# Patient Record
Sex: Female | Born: 1965 | Race: White | Hispanic: No | Marital: Married | Smoking: Never smoker
Health system: Southern US, Community
[De-identification: ages and names within clinical notes are randomized; demographics above are authoritative.]

## PROBLEM LIST (undated history)

## (undated) DIAGNOSIS — D219 Benign neoplasm of connective and other soft tissue, unspecified: Secondary | ICD-10-CM

## (undated) DIAGNOSIS — I10 Essential (primary) hypertension: Secondary | ICD-10-CM

## (undated) DIAGNOSIS — Z973 Presence of spectacles and contact lenses: Secondary | ICD-10-CM

## (undated) DIAGNOSIS — T7840XA Allergy, unspecified, initial encounter: Secondary | ICD-10-CM

## (undated) DIAGNOSIS — E119 Type 2 diabetes mellitus without complications: Secondary | ICD-10-CM

## (undated) DIAGNOSIS — K769 Liver disease, unspecified: Secondary | ICD-10-CM

## (undated) DIAGNOSIS — M1990 Unspecified osteoarthritis, unspecified site: Secondary | ICD-10-CM

## (undated) HISTORY — DX: Type 2 diabetes mellitus without complications: E11.9

## (undated) HISTORY — PX: SIGMOIDOSCOPY: SUR1295

## (undated) HISTORY — DX: Allergy, unspecified, initial encounter: T78.40XA

## (undated) HISTORY — PX: ENDOMETRIAL ABLATION W/ NOVASURE: SUR434

## (undated) HISTORY — DX: Essential (primary) hypertension: I10

## (undated) HISTORY — DX: Unspecified osteoarthritis, unspecified site: M19.90

---

## 1999-01-22 ENCOUNTER — Encounter: Admission: RE | Admit: 1999-01-22 | Discharge: 1999-01-22 | Payer: Self-pay | Admitting: *Deleted

## 1999-01-22 ENCOUNTER — Encounter: Payer: Self-pay | Admitting: *Deleted

## 1999-01-31 ENCOUNTER — Encounter: Admission: RE | Admit: 1999-01-31 | Discharge: 1999-02-06 | Payer: Self-pay | Admitting: *Deleted

## 1999-08-17 ENCOUNTER — Other Ambulatory Visit: Admission: RE | Admit: 1999-08-17 | Discharge: 1999-08-17 | Payer: Self-pay | Admitting: Family Medicine

## 2000-11-14 ENCOUNTER — Other Ambulatory Visit: Admission: RE | Admit: 2000-11-14 | Discharge: 2000-11-14 | Payer: Self-pay | Admitting: Family Medicine

## 2002-01-19 ENCOUNTER — Encounter: Payer: Self-pay | Admitting: *Deleted

## 2002-01-19 ENCOUNTER — Ambulatory Visit (HOSPITAL_COMMUNITY): Admission: RE | Admit: 2002-01-19 | Discharge: 2002-01-19 | Payer: Self-pay | Admitting: *Deleted

## 2002-06-22 ENCOUNTER — Inpatient Hospital Stay (HOSPITAL_COMMUNITY): Admission: AD | Admit: 2002-06-22 | Discharge: 2002-06-28 | Payer: Self-pay

## 2003-02-14 ENCOUNTER — Other Ambulatory Visit: Admission: RE | Admit: 2003-02-14 | Discharge: 2003-02-14 | Payer: Self-pay | Admitting: Obstetrics and Gynecology

## 2004-03-14 ENCOUNTER — Other Ambulatory Visit: Admission: RE | Admit: 2004-03-14 | Discharge: 2004-03-14 | Payer: Self-pay | Admitting: Obstetrics and Gynecology

## 2005-04-19 ENCOUNTER — Other Ambulatory Visit: Admission: RE | Admit: 2005-04-19 | Discharge: 2005-04-19 | Payer: Self-pay | Admitting: Obstetrics and Gynecology

## 2005-08-26 ENCOUNTER — Encounter: Admission: RE | Admit: 2005-08-26 | Discharge: 2005-08-26 | Payer: Self-pay | Admitting: Obstetrics and Gynecology

## 2005-10-29 ENCOUNTER — Inpatient Hospital Stay (HOSPITAL_COMMUNITY): Admission: RE | Admit: 2005-10-29 | Discharge: 2005-11-01 | Payer: Self-pay | Admitting: Obstetrics and Gynecology

## 2010-02-12 ENCOUNTER — Encounter: Admission: RE | Admit: 2010-02-12 | Discharge: 2010-02-12 | Payer: Self-pay | Admitting: Family Medicine

## 2010-08-03 NOTE — Discharge Summary (Signed)
NAME:  Holly Diaz, Holly Diaz                    ACCOUNT NO.:  [id number]   MEDICAL RECORD NO.:  [id number]                   PATIENT TYPE:  INP   LOCATION:  9148                                 FACILITY:  [facility]   PHYSICIAN:  James A. Ashley Royalty, M.D.             DATE OF BIRTH:  1965/06/05   DATE OF ADMISSION:  06/22/2002  DATE OF DISCHARGE:  06/28/2002                                 DISCHARGE SUMMARY   DISCHARGE DIAGNOSES:  1. Intrauterine pregnancy at term.  2. Empiric glucose tolerance.  3. Labor necessitating Cesarean section.  4. Uterine atony after delivery.  5. Anemia, secondary to blood loss.   PROCEDURE:  Primary low transverse cesarean section per Dr. Galen Daft.   CONSULTATIONS:  None.   DISCHARGE MEDICATIONS:  1. Percocet 5/325 one p.o. q.4-6h. p.r.n. pain.  2. Motrin 800 mg p.o. t.i.d. p.r.n. pain.  3. Chromagen one p.o. q.d.   HISTORY OF PRESENT ILLNESS:  This is a 45 year old, G2, P0, [facility] July 01, 2002, approximately 39 weeks' gestation.  Prenatal care was complicated by  advanced maternal age and positive Group B Streptococcus status.  The  patient had a three-hour GGT consistent with glucose intolerance.  She was  admitted for induction secondary to impending macrosomia.   HOSPITAL COURSE:  The patient was admitted to [facility] [place]  and admission laboratory studies were drawn.  Cervical ripening was  initiated.  On June 24, 2002, the patient was experienced spontaneous  rupture of membranes and meconium stained amniotic fluid was noted.  Induction was performed.  On June 25, 2002, the patient experienced an  arrest disorder of labor necessitating cesarean section.  Procedure was  performed by Dr. Galen Daft and yielded an 8 pound 9 ounce female with Apgar's 8 and  9 at one and five minutes who was sent to the newborn nursery.  The patient  was complicated by uterine atony which responded to Hemabate.  The patient's  postoperative course was complicated by  modest anemia which remained  essentially asymptomatic.  She was discharged on postop day #3, afebrile in  satisfactory condition.   LABORATORY DATA AND X-RAY FINDINGS:  Hemoglobin and hematocrit on admission  were 12.4 and 36.9 respectively.  Repeat values were obtained throughout the  hospitalization, the last of which was June 28, 2002, revealing a value of  7.7 and 23.3 respectively.  White blood count at the time of discharge was  7000.   FOLLOW UP:  The patient is to return to Dr. Zenia Resides office in four to six  weeks for postpartum evaluation.  She is also asked to call the office  tomorrow to ask about the possibility of obtaining a CBC to document rising  hemoglobin.    SPECIAL INSTRUCTIONS:  Full discharge instructions were given including to  call for fever or postural symptoms.  James A. Ashley Royalty, M.D.    JAM/MEDQ  D:  06/28/2002  T:  06/28/2002  Job:  [id number]

## 2010-08-03 NOTE — Op Note (Signed)
NAMESHANENA, PELLEGRINO          ACCOUNT NO.:  [id number]   MEDICAL RECORD NO.:  [id number]          PATIENT TYPE:  INP   LOCATION:  9107                          [facility]  [facility]   PHYSICIAN:  Michelle L. Grewal, M.D.DATE OF BIRTH:  1965-05-22   DATE OF PROCEDURE:  10/29/2005  DATE OF DISCHARGE:                                 OPERATIVE REPORT   PREOPERATIVE DIAGNOSIS:  Intrauterine pregnancy at term, previous cesarean  section.   POSTOPERATIVE DIAGNOSIS:  Intrauterine pregnancy at term, previous cesarean  section.   PROCEDURE:  Repeat low transverse cesarean section.   SURGEON:  Michelle L. Vincente Poli, M.D.   ANESTHESIA:  Spinal.   SPECIMENS:  Female infant, Apgar 9 at 1 minute and 9 at 5 minutes, weighing  8 pounds 9 ounces.   ESTIMATED BLOOD LOSS:  500 mL.   COMPLICATIONS:  None.   PROCEDURE:  The patient was taken to the operating room.  Her spinal was  placed without incident.  She was then prepped and draped in the usual  sterile fashion.  A Foley catheter was inserted.  A low transverse incision  was made at the area of the previous C-section and carried down to the  fascia.  The fascia was scored in the midline and extended laterally.  The  rectus muscles were separated in the midline and the peritoneum was entered  bluntly.  The peritoneal incision was then stretched.  The bladder blade was  inserted. The lower uterine segment was identified and the bladder flap was  created sharply and then digitally.  The bladder blade was then readjusted.  A low transverse incision was made in the uterus.  The uterus was entered  using a hemostat.  The amniotic fluid was clear.  The baby was delivered  quite easily with the assistance of a vacuum extractor.  The baby was a  female infant in cephalic presentation weighing 8 pounds 9 ounces with Apgar  9 at 1 minute and 9 at 5 minutes.  The cord was clamped and cut. The baby  was handed to the awaiting neonatologist.  The placenta  was manually removed  and noted to be normal, intact, with three-vessel cord.  The uterus was  exteriorized and cleared of all clots and debris.  There was multiple small  subserosal fibroids noted.  Nothing in the cavity was palpated. The uterine  incision was closed using 0 chromic in a continuous running locked stitch,  it was hemostatic.  The uterus was returned to the abdomen.  Irrigation was  performed.  Again, hemostasis was noted.  The peritoneum was closed using 0  Vicryl suture continuous running stitch and the rectus muscles were  reapproximated using the same 0 Vicryl.  The fascia was  closed using 0 Vicryl continuous running stitch, starting at each corner and  meeting in the midline. After irrigation of the subcutaneous layer, the skin  was closed with staples.  All sponge, lap and instrument counts were correct  x2.  The patient went to the recovery room in stable condition.      Michelle L. Vincente Poli, M.D.  Electronically Signed  MLG/MEDQ  D:  10/29/2005  T:  10/29/2005  Job:  [id number]

## 2010-08-03 NOTE — Op Note (Signed)
NAME:  Holly Diaz, Holly Diaz                    ACCOUNT NO.:  [id number]   MEDICAL RECORD NO.:  [id number]                   PATIENT TYPE:  INP   LOCATION:  9148                                 [facility]   PHYSICIAN:  Ronda Fairly. Galen Daft, M.D.              DATE OF BIRTH:  [birth year]   DATE OF PROCEDURE:  06/25/2002  DATE OF DISCHARGE:                                 OPERATIVE REPORT   PREOPERATIVE DIAGNOSES:  Arrest of labor and failed induction.   PREOPERATIVE DIAGNOSES:  Arrest of labor and failed induction, persistent  occiput posterior.   PROCEDURE:  Primary low transverse Cesarean section.   COMPLICATIONS:  None.   ESTIMATED BLOOD LOSS:  500 cc.   ANESTHESIA:  Epidural.   FINDINGS:  Female infant occiput posterior position.  Moderate meconium.  Apgars 8 at one minute and 9 at five minutes.  Weight 8 pounds 9 ounces.   DESCRIPTION OF PROCEDURE:  The patient was identified as Holly Diaz  and she had oxytocin for two and a half days going on her third day of  induction without any significant cervical change at 3.0 cm.  The patient  had cervical ripening on two evenings also with Cytotec without significant  improvement.  The fetal heart rate remained reassuring throughout the  induction course.  The patient had Betadine prep, sterile technique and  Pfannenstiel incision was utilized for abdominal access.  The abdomen was  entered without difficulty, clear of containing substances.  The bladder  blade was used and lateral retractors were used.  The bladder flap was  developed and the bladder was dissected down below the lower uterine  segment.  Low cervical transverse uterine incision was then carried out and  the baby was delivered occiput posterior.  Prior to delivery of the  shoulders after delivery of the head both nasopharynx and the oropharynx  were DeLee suctioned to remove the meconium.  The baby was then delivered  and pediatric neonatologist was there for  the attendance.  The cord blood  was obtained.  It was 7.37.  The Apgar scores were 8 and 9 and the weight  was 8 pounds 9 ounces.  His name is Jennette Kettle.  The placenta was removed and the  uterus was swept free of all containing substances.  It was slightly boggy  bu otherwise not bleeding much.  Uterus was closed with 0 Vicryl running  locking followed by an imbricating layer.  There was complete hemostasis  noted at the incision site.  Both tubes and ovaries are unremarkable.  There  was an anterior fibroid approximately 1.5 cm in size.  The abdomen was swept  free of all containing substances and irrigated and suctioned and the  subfascial tissues are all hemostatic.  The first instrument, sponge and  needle count was correct.  The muscles were reapproximated at the midline  with three interrupted sutures of #1 Vicryl and the fascia was then  closed with one Vicryl suture in a running fashion.  All subcutaneous  tissues were hemostatic and the skin was closed with a 3-0 Monocryl.  All  instrument, sponge and needle counts were correct throughout the case.  The  patient tolerated the procedure well, left the operating room in stable  condition.                                               Ronda Fairly. Galen Daft, M.D.    NJT/MEDQ  D:  06/25/2002  T:  06/25/2002  Job:  [id number]   cc:   Leatha Gilding. Mezer, M.D.  [address]  [place]  [place] [address]  Fax: [phone]

## 2010-08-03 NOTE — Discharge Summary (Signed)
Holly Diaz, Holly Diaz          ACCOUNT NO.:  [id number]   MEDICAL RECORD NO.:  [id number]          PATIENT TYPE:  INP   LOCATION:  9107                          [facility]   PHYSICIAN:  Dineen Kid. Rana Snare, M.D.    DATE OF BIRTH:  02-18-1966   DATE OF ADMISSION:  10/29/2005  DATE OF DISCHARGE:  11/01/2005                                 DISCHARGE SUMMARY   ADMITTING DIAGNOSES:  1. Intrauterine pregnancy at term.  2. Previous cesarean section, desires repeat.   DISCHARGE DIAGNOSES:  1,  Status post low transverse cesarean section.  1. Viable female infant.   PROCEDURE:  Repeat low transverse cesarean section.   REASON FOR ADMISSION:  Please see written H&P.   HOSPITAL COURSE:  The patient is 45 year old gravida 2, para 1 that  presented to [facility] for scheduled cesarean section.  The patient had a previous cesarean delivery and desired repeat.  On the  morning of admission, the patient was taken to the operating room where  spinal anesthesia was administered without difficulty.  Low transverse  incision was made with delivery of a viable female infant weighing 8 pounds  9 ounces with Apgars of 8 at 1 minute and 9 at 5 minutes.  The patient  tolerated procedure well and taken to the recovery room in stable condition.   On postoperative day #1, the patient was without complaint.  Vital signs  were stable.  She is afebrile.  Abdomen soft with good return of bowel  function.  Fundus was firm and nontender.  Abdominal dressing was noted to  be clean, dry and intact.  Laboratory findings revealed hemoglobin of 12.2,  platelet count 190,000, WBC 8.0.  The patient did have a history of  gestational diabetes which had been well controlled on diet.  She was  ordered for fasting blood sugar and 2-hour postprandial for in the a.m.   On postoperative day #2, the patient was without complaint.  Vital signs  remained stable.  Abdomen soft.  Fundus firm and nontender.   Incision was  clean, dry and intact.  Fasting blood sugar was 104 mg/dL.   On postoperative day #3, the patient was without complaint.  She had  experienced some nausea on the previous evening which had now resolved.  Vital signs were stable.  Abdomen soft.  Fundus firm and nontender.  Incision was clean, dry and intact.  Staples removed, and the patient was  later discharged home.   CONDITION ON DISCHARGE:  Good.   DIET:  Regular as tolerated.   ACTIVITY:  No heavy lifting, no driving x2 weeks.  No vaginal entry.   FOLLOWUP:  Patient to follow up in the office in 1 week for an incision  check.  She is to call for temperature greater than 100 degrees, persistent  nausea and vomiting, heavy vaginal bleeding and/or redness or drainage from  incisional site.   DISCHARGE MEDICATIONS:  1. Percocet 5/325, #30, 1 p.o. every 4-6 hours p.r.n.  2. Motrin 600 mg every 6 hours.  3. Citracal.  4. DHA.  5. Prenatal vitamins, dispense 100, 1 p.o. daily.  Julio Sicks, N.P.      Dineen Kid Rana Snare, M.D.  Electronically Signed    CC/MEDQ  D:  11/26/2005  T:  11/26/2005  Job:  [id number]

## 2011-03-15 ENCOUNTER — Other Ambulatory Visit: Payer: Self-pay | Admitting: Family Medicine

## 2011-03-15 DIAGNOSIS — Z1231 Encounter for screening mammogram for malignant neoplasm of breast: Secondary | ICD-10-CM

## 2011-04-16 ENCOUNTER — Ambulatory Visit
Admission: RE | Admit: 2011-04-16 | Discharge: 2011-04-16 | Disposition: A | Discharge status: Home / Self Care | Payer: 59 | Source: Ambulatory Visit | Attending: Family Medicine | Admitting: Family Medicine

## 2011-04-16 DIAGNOSIS — Z1231 Encounter for screening mammogram for malignant neoplasm of breast: Secondary | ICD-10-CM

## 2012-03-16 ENCOUNTER — Other Ambulatory Visit: Payer: Self-pay | Admitting: Family Medicine

## 2012-03-16 DIAGNOSIS — Z1231 Encounter for screening mammogram for malignant neoplasm of breast: Secondary | ICD-10-CM

## 2012-04-22 ENCOUNTER — Ambulatory Visit
Admission: RE | Admit: 2012-04-22 | Discharge: 2012-04-22 | Disposition: A | Discharge status: Home / Self Care | Payer: 59 | Source: Ambulatory Visit | Attending: Family Medicine | Admitting: Family Medicine

## 2012-04-22 DIAGNOSIS — Z1231 Encounter for screening mammogram for malignant neoplasm of breast: Secondary | ICD-10-CM

## 2012-04-24 ENCOUNTER — Ambulatory Visit (INDEPENDENT_AMBULATORY_CARE_PROVIDER_SITE_OTHER): Payer: 59

## 2012-04-24 ENCOUNTER — Other Ambulatory Visit: Payer: Self-pay | Admitting: Orthopedic Surgery

## 2012-04-24 DIAGNOSIS — M25569 Pain in unspecified knee: Secondary | ICD-10-CM

## 2012-04-24 DIAGNOSIS — M25469 Effusion, unspecified knee: Secondary | ICD-10-CM

## 2013-04-19 ENCOUNTER — Other Ambulatory Visit: Payer: Self-pay

## 2013-04-19 DIAGNOSIS — Z1231 Encounter for screening mammogram for malignant neoplasm of breast: Secondary | ICD-10-CM

## 2013-05-06 ENCOUNTER — Ambulatory Visit: Payer: 59

## 2013-05-14 ENCOUNTER — Ambulatory Visit: Payer: 59

## 2013-06-04 ENCOUNTER — Ambulatory Visit
Admission: RE | Admit: 2013-06-04 | Discharge: 2013-06-04 | Disposition: A | Discharge status: Home / Self Care | Payer: 59 | Source: Ambulatory Visit

## 2013-06-04 DIAGNOSIS — Z1231 Encounter for screening mammogram for malignant neoplasm of breast: Secondary | ICD-10-CM

## 2014-07-07 ENCOUNTER — Other Ambulatory Visit: Payer: Self-pay

## 2014-07-07 DIAGNOSIS — Z1231 Encounter for screening mammogram for malignant neoplasm of breast: Secondary | ICD-10-CM

## 2014-07-27 ENCOUNTER — Ambulatory Visit
Admission: RE | Admit: 2014-07-27 | Discharge: 2014-07-27 | Disposition: A | Discharge status: Home / Self Care | Payer: 59 | Source: Ambulatory Visit

## 2014-07-27 DIAGNOSIS — Z1231 Encounter for screening mammogram for malignant neoplasm of breast: Secondary | ICD-10-CM

## 2014-08-30 ENCOUNTER — Encounter: Payer: 59 | Attending: Family Medicine

## 2014-08-30 VITALS — Ht 66.0 in | Wt 201.3 lb

## 2014-08-30 DIAGNOSIS — Z713 Dietary counseling and surveillance: Secondary | ICD-10-CM | POA: Diagnosis not present

## 2014-08-30 DIAGNOSIS — E119 Type 2 diabetes mellitus without complications: Secondary | ICD-10-CM | POA: Insufficient documentation

## 2014-09-01 NOTE — Progress Notes (Signed)
Patient was seen on 08/30/14 for the first of a series of three diabetes self-management courses at the [facility].  Patient Education Plan per assessed needs and concerns is to attend four course education program for Diabetes Self Management Education.  The following learning objectives were met by the patient during this class:  Describe diabetes  State some common risk factors for diabetes  Defines the role of glucose and insulin  Identifies type of diabetes and pathophysiology  Describe the relationship between diabetes and cardiovascular risk  State the members of the Healthcare Team  States the rationale for glucose monitoring  State when to test glucose  State their individual Target Range  State the importance of logging glucose readings  Describe how to interpret glucose readings  Identifies A1C target  Explain the correlation between A1c and eAG values  State symptoms and treatment of high blood glucose  State symptoms and treatment of low blood glucose  Explain proper technique for glucose testing  Identifies proper sharps disposal  Handouts given during class include:  Living Well with Diabetes book  Carb Counting and Meal Planning book  Meal Plan Card  Carbohydrate guide  Meal planning worksheet  Low Sodium Flavoring Tips  The diabetes portion plate  T6I to eAG Conversion Chart  Diabetes Medications  Diabetes Recommended Care Schedule  Support Group  Diabetes Success Plan  Core Class Satisfaction Survey  Follow-Up Plan:  Attend core 2

## 2014-09-06 DIAGNOSIS — E118 Type 2 diabetes mellitus with unspecified complications: Secondary | ICD-10-CM

## 2014-09-06 DIAGNOSIS — E119 Type 2 diabetes mellitus without complications: Secondary | ICD-10-CM | POA: Diagnosis not present

## 2014-09-08 NOTE — Progress Notes (Signed)

## 2014-09-13 DIAGNOSIS — E119 Type 2 diabetes mellitus without complications: Secondary | ICD-10-CM

## 2014-09-13 NOTE — Progress Notes (Signed)
Patient was seen on 09/13/14 for the third of a series of three diabetes self-management courses at the [facility].   Catalina Gravel the amount of activity recommended for healthy living . Describe activities suitable for individual needs . Identify ways to regularly incorporate activity into daily life . Identify barriers to activity and ways to over come these barriers  Identify diabetes medications being personally used and their primary action for lowering glucose and possible side effects . Describe role of stress on blood glucose and develop strategies to address psychosocial issues . Identify diabetes complications and ways to prevent them  Explain how to manage diabetes during illness . Evaluate success in meeting personal goal . Establish 2-3 goals that they will plan to diligently work on until they return for the  13-month follow-up visit  Goals:   I will be active 20 minutes or more 5 times a week  To help manage stress I will  meditation at least 3 times a week  Your patient has identified these potential barriers to change:  Motivation Time Stress  Your patient has identified their diabetes self-care support plan as  Family Support On-line Resources Plan:  Attend Core 4 in 4 months

## 2015-01-11 ENCOUNTER — Ambulatory Visit: Payer: 59

## 2015-08-25 ENCOUNTER — Other Ambulatory Visit: Payer: Self-pay | Admitting: Family Medicine

## 2015-08-25 DIAGNOSIS — Z1231 Encounter for screening mammogram for malignant neoplasm of breast: Secondary | ICD-10-CM

## 2015-09-08 ENCOUNTER — Ambulatory Visit
Admission: RE | Admit: 2015-09-08 | Discharge: 2015-09-08 | Disposition: A | Discharge status: Home / Self Care | Payer: 59 | Source: Ambulatory Visit | Attending: Family Medicine | Admitting: Family Medicine

## 2015-09-08 DIAGNOSIS — Z1231 Encounter for screening mammogram for malignant neoplasm of breast: Secondary | ICD-10-CM

## 2016-08-28 ENCOUNTER — Other Ambulatory Visit: Payer: Self-pay | Admitting: Family Medicine

## 2016-08-28 DIAGNOSIS — Z1231 Encounter for screening mammogram for malignant neoplasm of breast: Secondary | ICD-10-CM

## 2016-09-17 ENCOUNTER — Ambulatory Visit
Admission: RE | Admit: 2016-09-17 | Discharge: 2016-09-17 | Disposition: A | Discharge status: Home / Self Care | Payer: 59 | Source: Ambulatory Visit | Attending: Family Medicine | Admitting: Family Medicine

## 2016-09-17 DIAGNOSIS — Z1231 Encounter for screening mammogram for malignant neoplasm of breast: Secondary | ICD-10-CM

## 2017-07-24 ENCOUNTER — Other Ambulatory Visit: Payer: Self-pay | Admitting: Family Medicine

## 2017-07-24 DIAGNOSIS — Z1231 Encounter for screening mammogram for malignant neoplasm of breast: Secondary | ICD-10-CM

## 2017-09-26 ENCOUNTER — Ambulatory Visit
Admission: RE | Admit: 2017-09-26 | Discharge: 2017-09-26 | Disposition: A | Discharge status: Home / Self Care | Payer: 59 | Source: Ambulatory Visit | Attending: Family Medicine | Admitting: Family Medicine

## 2017-09-26 DIAGNOSIS — Z1231 Encounter for screening mammogram for malignant neoplasm of breast: Secondary | ICD-10-CM

## 2018-02-19 ENCOUNTER — Ambulatory Visit
Admission: RE | Admit: 2018-02-19 | Discharge: 2018-02-19 | Disposition: A | Discharge status: Home / Self Care | Payer: 59 | Source: Ambulatory Visit | Attending: Family Medicine | Admitting: Family Medicine

## 2018-02-19 ENCOUNTER — Other Ambulatory Visit: Payer: Self-pay | Admitting: Family Medicine

## 2018-02-19 DIAGNOSIS — M545 Low back pain, unspecified: Secondary | ICD-10-CM

## 2018-10-05 ENCOUNTER — Other Ambulatory Visit: Payer: Self-pay | Admitting: Family Medicine

## 2018-10-05 DIAGNOSIS — Z1231 Encounter for screening mammogram for malignant neoplasm of breast: Secondary | ICD-10-CM

## 2018-11-19 ENCOUNTER — Ambulatory Visit
Admission: RE | Admit: 2018-11-19 | Discharge: 2018-11-19 | Disposition: A | Discharge status: Home / Self Care | Payer: 59 | Source: Ambulatory Visit | Attending: Family Medicine | Admitting: Family Medicine

## 2018-11-19 ENCOUNTER — Other Ambulatory Visit: Payer: Self-pay

## 2018-11-19 DIAGNOSIS — Z1231 Encounter for screening mammogram for malignant neoplasm of breast: Secondary | ICD-10-CM

## 2019-02-12 ENCOUNTER — Telehealth: Payer: Self-pay | Admitting: Hematology and Oncology

## 2019-02-12 NOTE — Telephone Encounter (Signed)
Received a new hem referral from Dr. Luciana Axe for elevated hematocrit. Ms. Holly Diaz has been cld and scheduled to see Dr. Alvy Bimler for a new hem appt on12/1 at 12:30pm. She's been made aware to arrive 15 minutes early.

## 2019-02-16 ENCOUNTER — Inpatient Hospital Stay: Payer: 59 | Attending: Hematology and Oncology | Admitting: Hematology and Oncology

## 2019-02-16 ENCOUNTER — Encounter: Payer: Self-pay | Admitting: Hematology and Oncology

## 2019-02-16 ENCOUNTER — Other Ambulatory Visit: Payer: Self-pay

## 2019-02-16 ENCOUNTER — Inpatient Hospital Stay: Payer: 59

## 2019-02-16 DIAGNOSIS — C946 Myelodysplastic disease, not classified: Secondary | ICD-10-CM | POA: Diagnosis present

## 2019-02-16 DIAGNOSIS — Z79899 Other long term (current) drug therapy: Secondary | ICD-10-CM | POA: Diagnosis not present

## 2019-02-16 DIAGNOSIS — E119 Type 2 diabetes mellitus without complications: Secondary | ICD-10-CM | POA: Insufficient documentation

## 2019-02-16 DIAGNOSIS — D471 Chronic myeloproliferative disease: Secondary | ICD-10-CM | POA: Diagnosis not present

## 2019-02-16 DIAGNOSIS — Z7984 Long term (current) use of oral hypoglycemic drugs: Secondary | ICD-10-CM | POA: Diagnosis not present

## 2019-02-16 DIAGNOSIS — D751 Secondary polycythemia: Secondary | ICD-10-CM | POA: Diagnosis not present

## 2019-02-16 DIAGNOSIS — M1990 Unspecified osteoarthritis, unspecified site: Secondary | ICD-10-CM | POA: Diagnosis not present

## 2019-02-16 DIAGNOSIS — I10 Essential (primary) hypertension: Secondary | ICD-10-CM | POA: Diagnosis not present

## 2019-02-16 LAB — CBC WITH DIFFERENTIAL/PLATELET
Abs Immature Granulocytes: 0.01 10*3/uL (ref 0.00–0.07)
Basophils Absolute: 0 10*3/uL (ref 0.0–0.1)
Basophils Relative: 0 %
Eosinophils Absolute: 0.1 10*3/uL (ref 0.0–0.5)
Eosinophils Relative: 3 %
HCT: 48.2 % — ABNORMAL HIGH (ref 36.0–46.0)
Hemoglobin: 15.7 g/dL — ABNORMAL HIGH (ref 12.0–15.0)
Immature Granulocytes: 0 %
Lymphocytes Relative: 27 %
Lymphs Abs: 1.5 10*3/uL (ref 0.7–4.0)
MCH: 27.1 pg (ref 26.0–34.0)
MCHC: 32.6 g/dL (ref 30.0–36.0)
MCV: 83.2 fL (ref 80.0–100.0)
Monocytes Absolute: 0.4 10*3/uL (ref 0.1–1.0)
Monocytes Relative: 6 %
Neutro Abs: 3.7 10*3/uL (ref 1.7–7.7)
Neutrophils Relative %: 64 %
Platelets: 275 10*3/uL (ref 150–400)
RBC: 5.79 MIL/uL — ABNORMAL HIGH (ref 3.87–5.11)
RDW: 13.2 % (ref 11.5–15.5)
WBC: 5.7 10*3/uL (ref 4.0–10.5)
nRBC: 0 % (ref 0.0–0.2)

## 2019-02-17 NOTE — Assessment & Plan Note (Signed)
I discussed extensively with the patient approach for diagnosis and management of polycythemia Statistically speaking, I suspect the polycythemia seen is likely secondary to other causes, most likely undiagnosed obstructive sleep apnea in her situation, given she has class I obesity However, she does have strong family history of polycythemia vera in her mother Even though it is rare to see heritability of polycythemia vera, it is not unreasonable to proceed to rule out polycythemia vera right upfront in her situation I recommend repeat CBC along with JAK2 mutation for further evaluation If the test came back negative, we will proceed to work-up for other secondary causes She understood the plan of care I will call her with test results Currently, she is not symptomatic

## 2019-02-17 NOTE — Progress Notes (Signed)
[name] CONSULT NOTE  Patient Care Team: Bartholome Bill, MD as PCP - General (Family Medicine)   ASSESSMENT & PLAN Myeloproliferative disorder [facility]) I discussed extensively with the patient approach for diagnosis and management of polycythemia Statistically speaking, I suspect the polycythemia seen is likely secondary to other causes, most likely undiagnosed obstructive sleep apnea in her situation, given she has class I obesity However, she does have strong family history of polycythemia vera in her mother Even though it is rare to see heritability of polycythemia vera, it is not unreasonable to proceed to rule out polycythemia vera right upfront in her situation I recommend repeat CBC along with JAK2 mutation for further evaluation If the test came back negative, we will proceed to work-up for other secondary causes She understood the plan of care I will call her with test results Currently, she is not symptomatic  Orders Placed This Encounter  Procedures  . CBC with Differential    Standing Status:   Future    Number of Occurrences:   1    Standing Expiration Date:   03/22/2020  . Jak 2 V617F ([facility])    Standing Status:   Future    Number of Occurrences:   1    Standing Expiration Date:   02/16/2020    All questions were answered. The patient knows to call the clinic with any problems, questions or concerns. I spent 40 minutes counseling the patient face to face. The total time spent in the appointment was 55 minutes and more than 50% was on counseling.    Heath Lark, MD 02/17/2019 8:47 AM   CHIEF COMPLAINTS/PURPOSE OF CONSULTATION:  Erythrocytosis  HISTORY OF PRESENTING ILLNESS:  Holly Diaz 53 y.o. female is here because of elevated hemoglobin.  She was found to have abnormal CBC from routine blood work performed at her primary care doctor's office I have the opportunity to review her CBC all the way to 2012 She had history of  microcytic anemia, likely iron deficiency secondary to menorrhagia She had been prescribed oral iron supplement in the past but never received blood transfusion She had endometrial ablation last year and that helped with her menorrhagia.  She has occasional vaginal spotting only.  She felt that she is likely going through menopausal symptoms  She denies intermittent headaches, shortness of breath on exertion, frequent leg cramps and occasional chest pain.  She never suffer from diagnosis of blood clot.  There is no prior diagnosis of obstructive sleep apnea. The patient denies weight loss or skin itching.  The patient is a non-smoker  Most importantly, the patient is seen by me because of strong family history of polycythemia vera in her mother Her mother is a patient of mine who is currently being treated with hydroxyurea She has concern about possible in heritability of myeloproliferative disorder and requested to be seen in rule out polycythemia vera  MEDICAL HISTORY:  Past Medical History:  Diagnosis Date  . Allergy   . Arthritis   . Diabetes mellitus without complication ([place])   . Hypertension     SURGICAL HISTORY: Past Surgical History:  Procedure Laterality Date  . CESAREAN SECTION    . ENDOMETRIAL ABLATION W/ NOVASURE N/A     SOCIAL HISTORY: Social History   Socioeconomic History  . Marital status: Married    Spouse name: Not on file  . Number of children: 2  . Years of education: Not on file  . Highest education level: Not on  file  Occupational History  . Not on file  Social Needs  . Financial resource strain: Not on file  . Food insecurity    Worry: Not on file    Inability: Not on file  . Transportation needs    Medical: Not on file    Non-medical: Not on file  Tobacco Use  . Smoking status: Never Smoker  . Smokeless tobacco: Never Used  Substance and Sexual Activity  . Alcohol use: Yes    Alcohol/week: 1.0 standard drinks    Types: 1 Glasses of wine  per week  . Drug use: Not on file  . Sexual activity: Not on file  Lifestyle  . Physical activity    Days per week: Not on file    Minutes per session: Not on file  . Stress: Not on file  Relationships  . Social Herbalist on phone: Not on file    Gets together: Not on file    Attends religious service: Not on file    Active member of club or organization: Not on file    Attends meetings of clubs or organizations: Not on file    Relationship status: Not on file  . Intimate partner violence    Fear of current or ex partner: Not on file    Emotionally abused: Not on file    Physically abused: Not on file    Forced sexual activity: Not on file  Other Topics Concern  . Not on file  Social History Narrative  . Not on file    FAMILY HISTORY: Family History  Problem Relation Age of Onset  . Cancer Mother        MPD  . Cancer Father 75       pancreatic ca    ALLERGIES:  has No Known Allergies.  MEDICATIONS:  Current Outpatient Medications  Medication Sig Dispense Refill  . cetirizine (ZYRTEC) 10 MG chewable tablet Chew 10 mg by mouth daily.    . empagliflozin (JARDIANCE) 25 MG TABS tablet Take 25 mg by mouth daily.    . cholecalciferol (VITAMIN D) 1000 UNITS tablet Take 2,000 Units by mouth 2 (two) times daily.    Marland Kitchen losartan (COZAAR) 25 MG tablet Take 25 mg by mouth daily.    . metFORMIN (GLUCOPHAGE) 1000 MG tablet Take 1,000 mg by mouth 2 (two) times daily with a meal.     No current facility-administered medications for this visit.     REVIEW OF SYSTEMS:   Constitutional: Denies fevers, chills or abnormal night sweats Eyes: Denies blurriness of vision, double vision or watery eyes Ears, nose, mouth, throat, and face: Denies mucositis or sore throat Respiratory: Denies cough, dyspnea or wheezes Cardiovascular: Denies palpitation, chest discomfort or lower extremity swelling Gastrointestinal:  Denies nausea, heartburn or change in bowel habits Skin: Denies  abnormal skin rashes Lymphatics: Denies new lymphadenopathy or easy bruising Neurological:Denies numbness, tingling or new weaknesses Behavioral/Psych: Mood is stable, no new changes  All other systems were reviewed with the patient and are negative.  PHYSICAL EXAMINATION: ECOG PERFORMANCE STATUS: 0 - Asymptomatic  Vitals:   02/16/19 1224  BP: (!) 134/91  Pulse: 92  Resp: 17  Temp: 98 F (36.7 C)  SpO2: 97%   Filed Weights   02/16/19 1224  Weight: 198 lb 4.8 oz (89.9 kg)    GENERAL:alert, no distress and comfortable SKIN: skin color, texture, turgor are normal, no rashes or significant lesions EYES: normal, conjunctiva are pink and non-injected, sclera  clear OROPHARYNX:no exudate, no erythema and lips, buccal mucosa, and tongue normal  NECK: supple, thyroid normal size, non-tender, without nodularity LYMPH:  no palpable lymphadenopathy in the cervical, axillary or inguinal LUNGS: clear to auscultation and percussion with normal breathing effort HEART: regular rate & rhythm and no murmurs and no lower extremity edema ABDOMEN:abdomen soft, non-tender and normal bowel sounds Musculoskeletal:no cyanosis of digits and no clubbing  PSYCH: alert & oriented x 3 with fluent speech NEURO: no focal motor/sensory deficits  LABORATORY DATA:  I have reviewed the data as listed Recent Results (from the past 2160 hour(s))  CBC with Differential     Status: Abnormal   Collection Time: 02/16/19 12:59 PM  Result Value Ref Range   WBC 5.7 4.0 - 10.5 K/uL   RBC 5.79 (H) 3.87 - 5.11 MIL/uL   Hemoglobin 15.7 (H) 12.0 - 15.0 g/dL   HCT 48.2 (H) 36.0 - 46.0 %   MCV 83.2 80.0 - 100.0 fL   MCH 27.1 26.0 - 34.0 pg   MCHC 32.6 30.0 - 36.0 g/dL   RDW 13.2 11.5 - 15.5 %   Platelets 275 150 - 400 K/uL   nRBC 0.0 0.0 - 0.2 %   Neutrophils Relative % 64 %   Neutro Abs 3.7 1.7 - 7.7 K/uL   Lymphocytes Relative 27 %   Lymphs Abs 1.5 0.7 - 4.0 K/uL   Monocytes Relative 6 %   Monocytes Absolute  0.4 0.1 - 1.0 K/uL   Eosinophils Relative 3 %   Eosinophils Absolute 0.1 0.0 - 0.5 K/uL   Basophils Relative 0 %   Basophils Absolute 0.0 0.0 - 0.1 K/uL   Immature Granulocytes 0 %   Abs Immature Granulocytes 0.01 0.00 - 0.07 K/uL    Comment: Performed at [facility], [address] [place], [place] [address]

## 2019-02-24 ENCOUNTER — Telehealth: Payer: Self-pay | Admitting: Hematology and Oncology

## 2019-02-24 NOTE — Telephone Encounter (Signed)
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 She does not need phlebotomy for mild polycythemia

## 2019-02-25 LAB — JAK 2 V617F (GENPATH)

## 2019-11-29 ENCOUNTER — Other Ambulatory Visit: Payer: Self-pay | Admitting: Family Medicine

## 2019-11-29 DIAGNOSIS — Z1231 Encounter for screening mammogram for malignant neoplasm of breast: Secondary | ICD-10-CM

## 2019-12-13 ENCOUNTER — Ambulatory Visit
Admission: RE | Admit: 2019-12-13 | Discharge: 2019-12-13 | Disposition: A | Discharge status: Home / Self Care | Payer: 59 | Source: Ambulatory Visit | Attending: Family Medicine | Admitting: Family Medicine

## 2019-12-13 ENCOUNTER — Other Ambulatory Visit: Payer: Self-pay

## 2019-12-13 DIAGNOSIS — Z1231 Encounter for screening mammogram for malignant neoplasm of breast: Secondary | ICD-10-CM

## 2021-01-22 ENCOUNTER — Other Ambulatory Visit: Payer: Self-pay | Admitting: Family Medicine

## 2021-01-22 DIAGNOSIS — Z1231 Encounter for screening mammogram for malignant neoplasm of breast: Secondary | ICD-10-CM

## 2021-02-21 ENCOUNTER — Encounter: Payer: Self-pay | Admitting: Hematology and Oncology

## 2021-02-22 ENCOUNTER — Telehealth: Payer: Self-pay

## 2021-02-22 NOTE — Telephone Encounter (Signed)
Called and given below message from Dr. Alvy Bimler.   "Hi Holly Diaz,   Thank you for reaching out. I reviewed your labs recently with my desk nurse a few weeks ago. Not sure why you were not contacted.  The 2 hematology issues I can see are 1) B12 deficiency; I believe you are receiving treatment for this 2) mild erythrocytosis; no significant changes from my previous evaluation. You do not have polycythemia vera. Common causes of high hemoglobin is undiagnosed obstructive apnea and smoking (I know you do not smoke). You do not need to get phlebotomy. Best way to treat sleep apnea is exercise and weight loss.   If you would like an appointment, I can set up in person visit or virtual visit to discuss this."  She verbalized understanding and does not need appt. She is having mychart issues and was unable to see message.

## 2021-02-23 ENCOUNTER — Ambulatory Visit
Admission: RE | Admit: 2021-02-23 | Discharge: 2021-02-23 | Disposition: A | Discharge status: Home / Self Care | Payer: 59 | Source: Ambulatory Visit | Attending: Family Medicine | Admitting: Family Medicine

## 2021-02-23 ENCOUNTER — Other Ambulatory Visit: Payer: Self-pay

## 2021-02-23 DIAGNOSIS — Z1231 Encounter for screening mammogram for malignant neoplasm of breast: Secondary | ICD-10-CM

## 2021-10-12 HISTORY — PX: COLONOSCOPY: SHX174

## 2022-02-04 ENCOUNTER — Other Ambulatory Visit: Payer: Self-pay | Admitting: Family Medicine

## 2022-02-04 DIAGNOSIS — Z1231 Encounter for screening mammogram for malignant neoplasm of breast: Secondary | ICD-10-CM

## 2022-03-07 ENCOUNTER — Encounter: Payer: Self-pay | Admitting: Hematology and Oncology

## 2022-04-02 ENCOUNTER — Ambulatory Visit
Admission: RE | Admit: 2022-04-02 | Discharge: 2022-04-02 | Disposition: A | Discharge status: Home / Self Care | Payer: 59 | Source: Ambulatory Visit | Attending: Family Medicine | Admitting: Family Medicine

## 2022-04-02 DIAGNOSIS — Z1231 Encounter for screening mammogram for malignant neoplasm of breast: Secondary | ICD-10-CM

## 2022-08-24 IMAGING — MG DIGITAL SCREENING BILAT W/ TOMO W/ CAD
8 series · 8 of 24 positions shown · non-contrast
Comparison: Previous exam(s).

CLINICAL DATA: Screening.

EXAM:
DIGITAL SCREENING BILATERAL MAMMOGRAM WITH TOMO AND CAD

[L CC synth-2D]
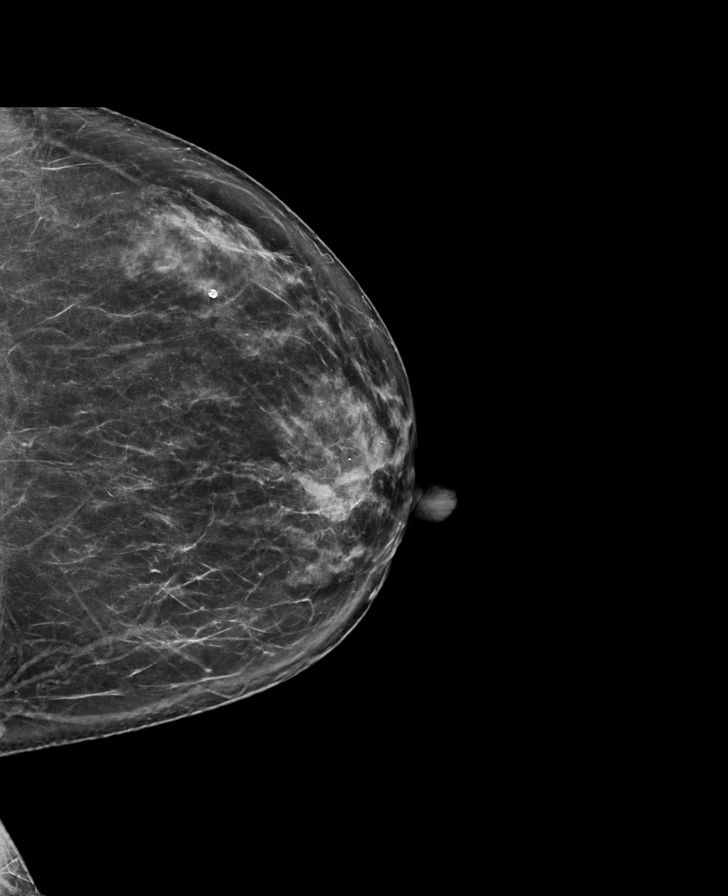

[R CC synth-2D]
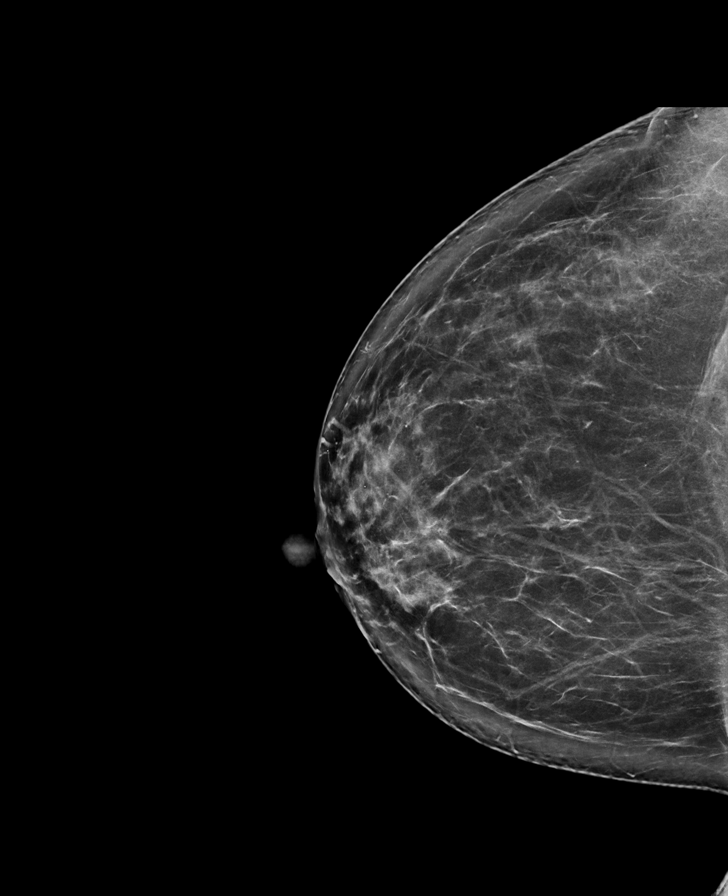

[R MLO synth-2D]
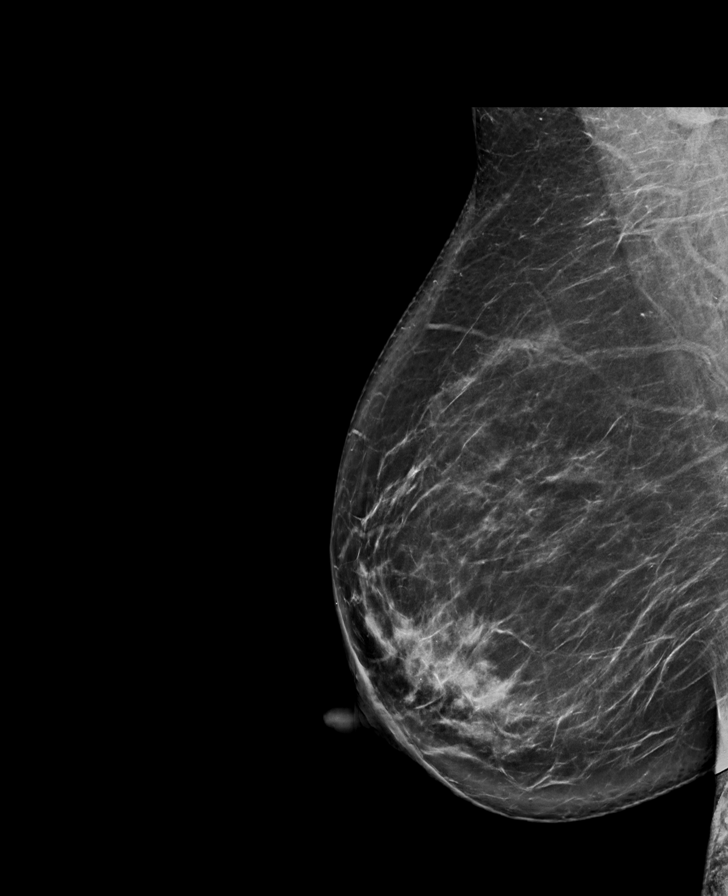

[L MLO synth-2D]
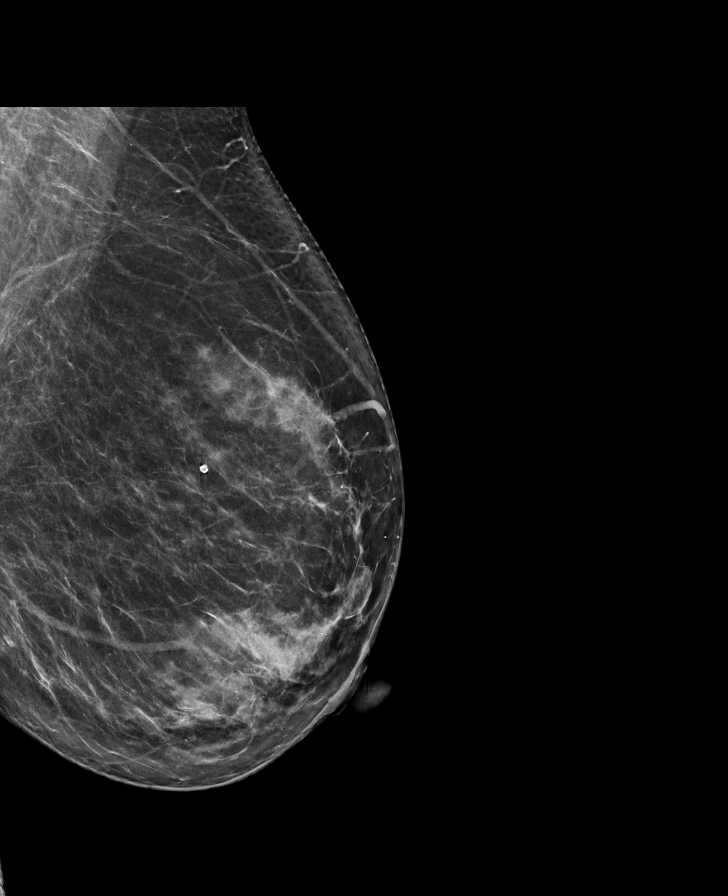

[L MLO tomo · tomo slice 43/85.0]
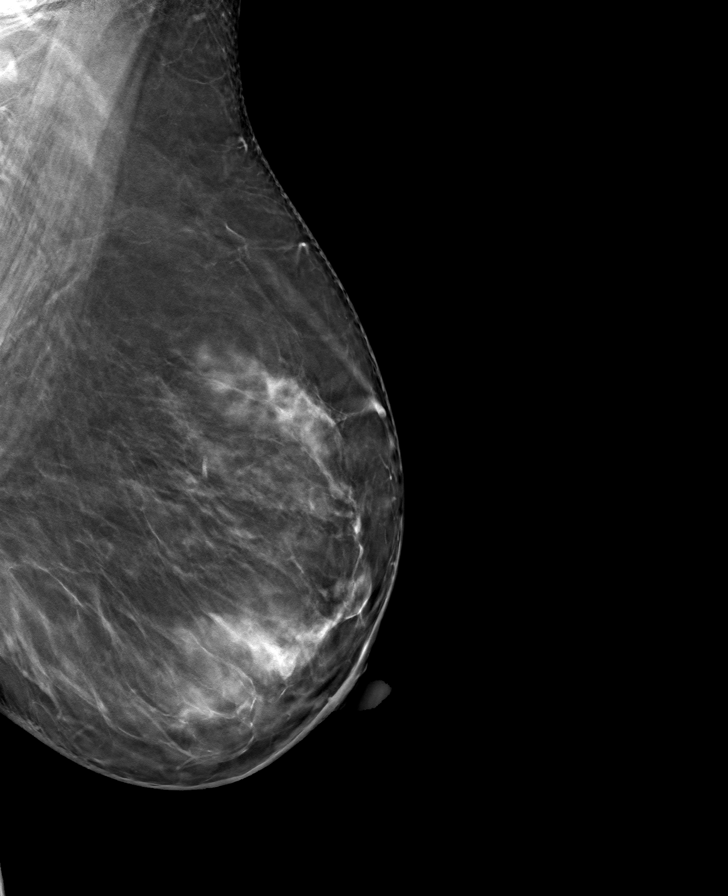

[L CC tomo · tomo slice 41/81.0]
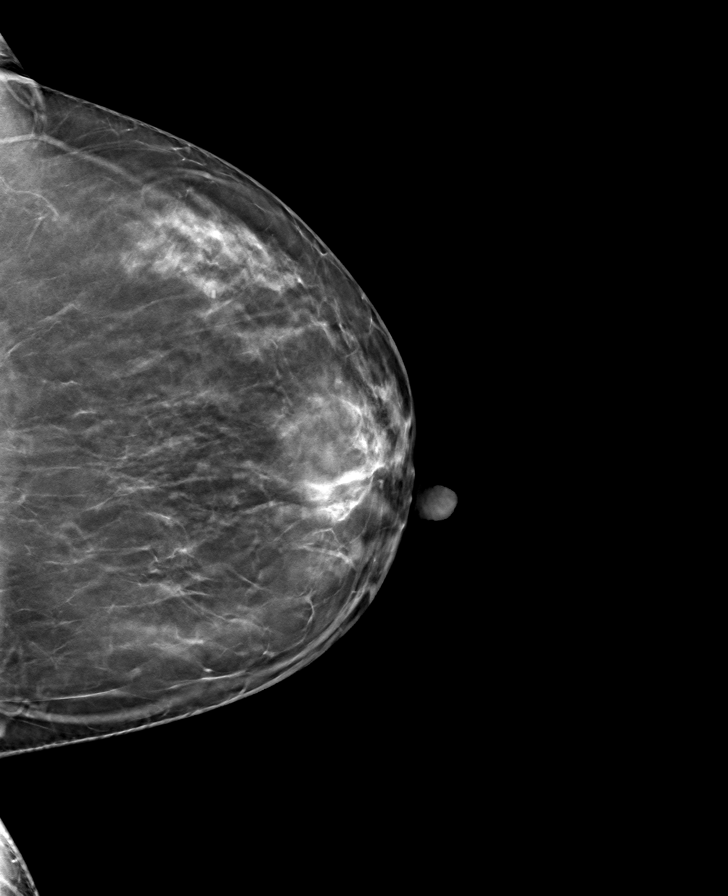

[R MLO tomo · tomo slice 47/93.0]
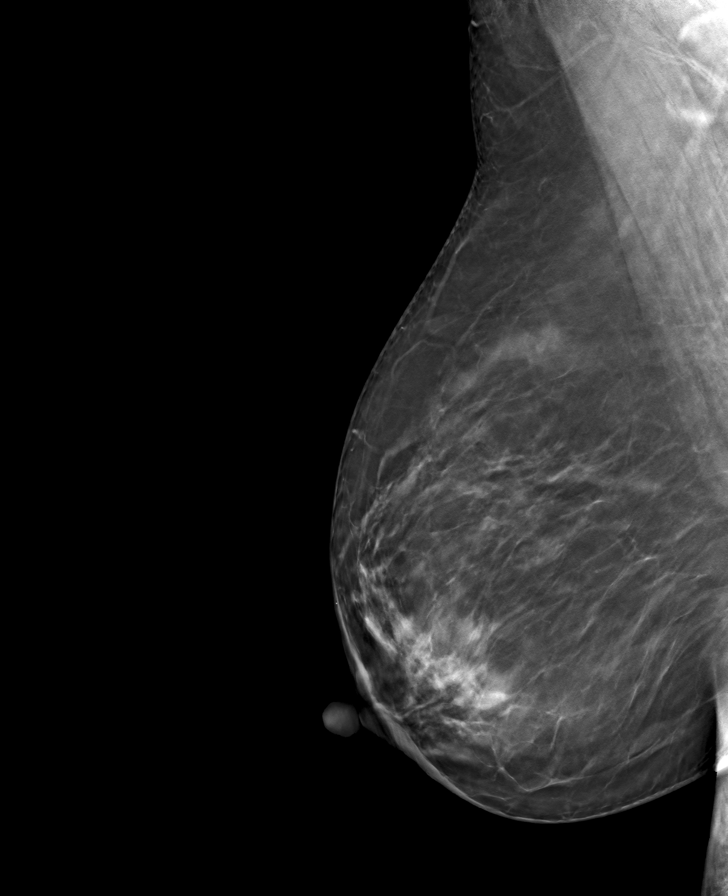

[R CC tomo · tomo slice 45/89.0]
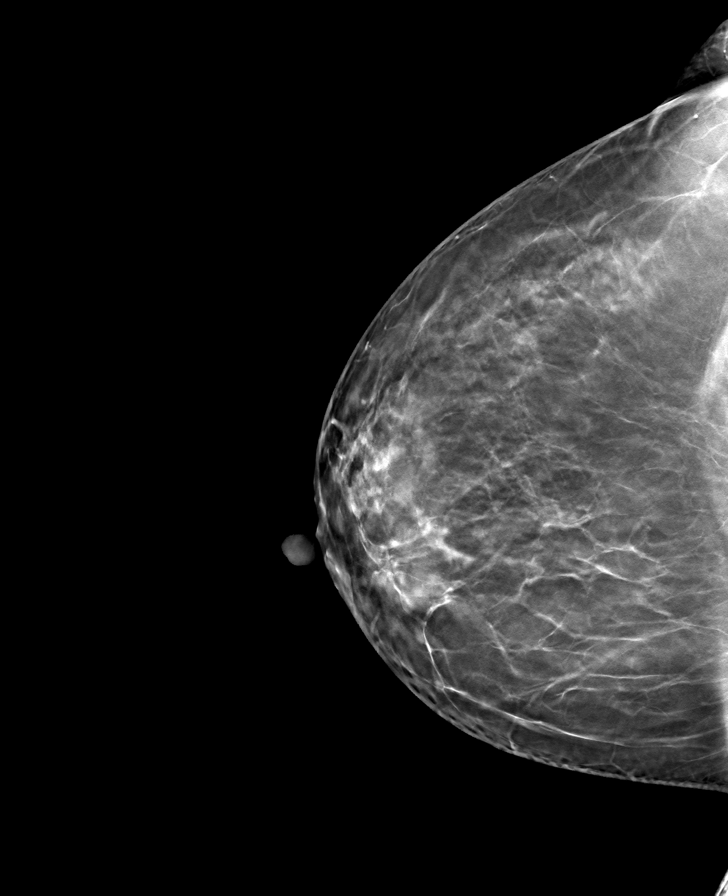

[8 of 24 positions shown; findings below may reference images not displayed]

ACR Breast Density Category b: There are scattered areas of
fibroglandular density.
FINDINGS: There are no findings suspicious for malignancy. Images were
processed with CAD.
IMPRESSION: No mammographic evidence of malignancy. A result letter of this
screening mammogram will be mailed directly to the patient.

RECOMMENDATION:
Screening mammogram in one year. (Code:[id number])

BI-RADS CATEGORY  1: Negative.

## 2022-10-23 ENCOUNTER — Other Ambulatory Visit: Payer: Self-pay | Admitting: Obstetrics and Gynecology

## 2022-10-23 DIAGNOSIS — Z01818 Encounter for other preprocedural examination: Secondary | ICD-10-CM

## 2022-12-11 ENCOUNTER — Encounter (HOSPITAL_BASED_OUTPATIENT_CLINIC_OR_DEPARTMENT_OTHER): Payer: Self-pay

## 2022-12-11 ENCOUNTER — Ambulatory Visit (HOSPITAL_BASED_OUTPATIENT_CLINIC_OR_DEPARTMENT_OTHER): Admit: 2022-12-11 | Payer: 59 | Admitting: Obstetrics and Gynecology

## 2022-12-11 SURGERY — HYSTERECTOMY, TOTAL, ROBOT-ASSISTED, LAPAROSCOPIC, WITH BILATERAL SALPINGO-OOPHORECTOMY
Anesthesia: General

## 2023-01-16 ENCOUNTER — Encounter (HOSPITAL_BASED_OUTPATIENT_CLINIC_OR_DEPARTMENT_OTHER): Payer: Self-pay | Admitting: Obstetrics and Gynecology

## 2023-01-17 ENCOUNTER — Encounter (HOSPITAL_BASED_OUTPATIENT_CLINIC_OR_DEPARTMENT_OTHER): Payer: Self-pay | Admitting: Obstetrics and Gynecology

## 2023-01-17 ENCOUNTER — Other Ambulatory Visit: Payer: Self-pay

## 2023-01-17 NOTE — Progress Notes (Signed)
Your procedure is scheduled on Monday, 01/27/2023.  Report to [facility] [place] AT  5:30 AM.   Call this number if you have problems the morning of surgery  [phone].   OUR ADDRESS IS [address].  WE ARE LOCATED IN [facility].  PLEASE BRING YOUR INSURANCE CARD AND PHOTO ID DAY OF SURGERY.  ONLY 2 PEOPLE ARE ALLOWED IN  WAITING  ROOM                                      REMEMBER:  DO NOT EAT FOOD, CANDY GUM OR MINTS  AFTER MIDNIGHT THE NIGHT BEFORE YOUR SURGERY . YOU MAY HAVE CLEAR LIQUIDS FROM MIDNIGHT THE NIGHT BEFORE YOUR SURGERY UNTIL  4:30 AM. NO CLEAR LIQUIDS AFTER   4:30 AM DAY OF SURGERY.  YOU MAY  BRUSH YOUR TEETH MORNING OF SURGERY AND RINSE YOUR MOUTH OUT, NO CHEWING GUM CANDY OR MINTS.     CLEAR LIQUID DIET    Allowed      Water                                                                   Coffee and tea, regular and decaf  (NO cream or milk products of any type, may sweeten)                         Carbonated beverages, regular and diet                                    Sports drinks like Gatorade _____________________________________________________________________     TAKE ONLY THESE MEDICATIONS MORNING OF SURGERY: Zyrtec, nasal spray Do NOT take Metformin on the morning of surgery. Hold Mounjaro x 7 days before surgery. Last dose of Mounjaro before surgery should be on 01/20/23.                                        DO NOT WEAR JEWERLY/  METAL/  PIERCINGS (INCLUDING NO PLASTIC PIERCINGS) DO NOT WEAR LOTIONS, POWDERS, PERFUMES OR NAIL POLISH ON YOUR FINGERNAILS. TOENAIL POLISH IS OK TO WEAR. DO NOT SHAVE FOR 48 HOURS PRIOR TO DAY OF SURGERY.  CONTACTS, GLASSES, OR DENTURES MAY NOT BE WORN TO SURGERY.  REMEMBER: NO SMOKING, VAPING ,  DRUGS OR ALCOHOL FOR 24 HOURS BEFORE YOUR SURGERY.                                    [facility] IS NOT RESPONSIBLE  FOR ANY BELONGINGS.                                                                     Marland Kitchen  [facility] - Preparing for Surgery Before surgery, you can play an important role.  Because skin is not sterile, your skin needs to be as free of germs as possible.  You can reduce the number of germs on your skin by washing with CHG (chlorahexidine gluconate) soap before surgery.  CHG is an antiseptic cleaner which kills germs and bonds with the skin to continue killing germs even after washing. Please DO NOT use if you have an allergy to CHG or antibacterial soaps.  If your skin becomes reddened/irritated stop using the CHG and inform your nurse when you arrive at Short Stay. Do not shave (including legs and underarms) for at least 48 hours prior to the first CHG shower.  You may shave your face/neck. Please follow these instructions carefully:  1.  Shower with CHG Soap the night before surgery and the  morning of Surgery.  2.  If you choose to wash your hair, wash your hair first as usual with your  normal  shampoo.  3.  After you shampoo, rinse your hair and body thoroughly to remove the  shampoo.                                        4.  Use CHG as you would any other liquid soap.  You can apply chg directly  to the skin and wash , chg soap provided, night before and morning of your surgery.  5.  Apply the CHG Soap to your body ONLY FROM THE NECK DOWN.   Do not use on face/ open                           Wound or open sores. Avoid contact with eyes, ears mouth and genitals (private parts).                       Wash face,  Genitals (private parts) with your normal soap.             6.  Wash thoroughly, paying special attention to the area where your surgery  will be performed.  7.  Thoroughly rinse your body with warm water from the neck down.  8.  DO NOT shower/wash with your normal soap after using and rinsing off  the CHG Soap.             9.  Pat yourself dry with a clean towel.            10.  Wear clean pajamas.            11.  Place clean  sheets on your bed the night of your first shower and do not  sleep with pets. Day of Surgery : Do not apply any lotions/ powders the morning of surgery.  Please wear clean clothes to the hospital/surgery center.  IF YOU HAVE ANY SKIN IRRITATION OR PROBLEMS WITH THE SURGICAL SOAP, PLEASE GET A BAR OF GOLD DIAL SOAP AND SHOWER THE NIGHT BEFORE YOUR SURGERY AND THE MORNING OF YOUR SURGERY. PLEASE LET THE NURSE KNOW MORNING OF YOUR SURGERY IF YOU HAD ANY PROBLEMS WITH THE SURGICAL SOAP.   YOUR SURGEON MAY HAVE REQUESTED EXTENDED RECOVERY TIME AFTER YOUR SURGERY. IT COULD BE A  JUST A FEW HOURS  UP TO AN OVERNIGHT STAY.  YOUR SURGEON SHOULD HAVE DISCUSSED  THIS WITH YOU PRIOR TO YOUR SURGERY. IN THE EVENT YOU NEED TO STAY OVERNIGHT PLEASE REFER TO THE FOLLOWING GUIDELINES. YOU MAY HAVE UP TO 4 VISITORS  MAY VISIT IN THE EXTENDED RECOVERY ROOM UNTIL 800 PM ONLY.  ONE  VISITOR AGE 45 AND OVER MAY SPEND THE NIGHT AND MUST BE IN EXTENDED RECOVERY ROOM NO LATER THAN 800 PM . YOUR DISCHARGE TIME AFTER YOU SPEND THE NIGHT IS 900 AM THE MORNING AFTER YOUR SURGERY. YOU MAY PACK A SMALL OVERNIGHT BAG WITH TOILETRIES FOR YOUR OVERNIGHT STAY IF YOU WISH.  REGARDLESS OF IF YOU STAY OVER NIGHT OR ARE DISCHARGED THE SAME DAY YOU WILL BE REQUIRED TO HAVE A RESPONSIBLE ADULT (18 YRS OLD OR OLDER) STAY WITH YOU FOR AT LEAST THE FIRST 24 HOURS  YOUR PRESCRIPTION MEDICATIONS WILL BE PROVIDED DURING YOUR HOSPITAL STAY.  ________________________________________________________________________                                                        QUESTIONS Holly Diaz PRE OP NURSE PHONE [phone].

## 2023-01-17 NOTE — Progress Notes (Addendum)
Spoke w/ via phone for pre-op interview---[facility] Lab needs dos----none      Lab results------01/23/23 lab appt for cbc, bmp, type & screen, ekg COVID test -----patient states asymptomatic no test needed Arrive at -------0530 on Monday, 01/27/2023 NPO after MN NO Solid Food.  Clear liquids from MN until---0430 Med rec completed Medications to take morning of surgery -----Zyrtec & nasal spray Diabetic medication -----Do not take Metformin on the morning of surgery. Hold Mounjaro x 7 days before surgery. Last dose should be on 01/20/2023. Patient is aware. Patient instructed no nail polish to be worn day of surgery Patient instructed to bring photo id and insurance card day of surgery Patient aware to have Driver (ride ) / caregiver    for 24 hours after surgery - husband, Premjit Patient Special Instructions -----Extended / overnight stay instructions given. Pre-Op special Instructions -----none Patient verbalized understanding of instructions that were given at this phone interview. Patient denies chest pain, sob, fever, cough at the interview.

## 2023-01-23 ENCOUNTER — Encounter (HOSPITAL_COMMUNITY)
Admission: RE | Admit: 2023-01-23 | Discharge: 2023-01-23 | Disposition: A | Discharge status: Home / Self Care | Payer: 59 | Source: Ambulatory Visit | Attending: Obstetrics and Gynecology | Admitting: Obstetrics and Gynecology

## 2023-01-23 DIAGNOSIS — Z01818 Encounter for other preprocedural examination: Secondary | ICD-10-CM | POA: Diagnosis present

## 2023-01-23 LAB — BASIC METABOLIC PANEL
Anion gap: 10 (ref 5–15)
BUN: 18 mg/dL (ref 6–20)
CO2: 22 mmol/L (ref 22–32)
Calcium: 9.4 mg/dL (ref 8.9–10.3)
Chloride: 104 mmol/L (ref 98–111)
Creatinine, Ser: 0.64 mg/dL (ref 0.44–1.00)
GFR, Estimated: >60 mL/min (ref >60)
Glucose, Bld: 90 mg/dL (ref 70–99)
Potassium: 3.7 mmol/L (ref 3.5–5.1)
Sodium: 136 mmol/L (ref 135–145)

## 2023-01-23 LAB — CBC
HCT: 44.1 % (ref 36.0–46.0)
Hemoglobin: 14.4 g/dL (ref 12.0–15.0)
MCH: 28.1 pg (ref 26.0–34.0)
MCHC: 32.7 g/dL (ref 30.0–36.0)
MCV: 86.1 fL (ref 80.0–100.0)
Platelets: 321 10*3/uL (ref 150–400)
RBC: 5.12 MIL/uL — ABNORMAL HIGH (ref 3.87–5.11)
RDW: 12.8 % (ref 11.5–15.5)
WBC: 7.4 10*3/uL (ref 4.0–10.5)
nRBC: 0 % (ref 0.0–0.2)

## 2023-01-25 NOTE — Anesthesia Preprocedure Evaluation (Signed)
Anesthesia Evaluation  Patient identified by MRN, date of birth, ID band Patient awake    Reviewed: Allergy & Precautions, NPO status , Patient's Chart, lab work & pertinent test results  History of Anesthesia Complications Negative for: history of anesthetic complications  Airway Mallampati: III  TM Distance: >3 FB Neck ROM: Full    Dental  (+) Dental Advisory Given   Pulmonary neg pulmonary ROS, neg shortness of breath, neg sleep apnea, neg COPD, neg recent URI   Pulmonary exam normal breath sounds clear to auscultation       Cardiovascular hypertension (losartan), Pt. on medications (-) angina (-) Past MI, (-) Cardiac Stents and (-) CABG (-) dysrhythmias  Rhythm:Regular Rate:Normal     Neuro/Psych neg Seizures negative neurological ROS     GI/Hepatic negative GI ROS,neg GERD  ,,Liver lesion, likely benign hemangioma   Endo/Other  diabetes, Type 2, Oral Hypoglycemic Agents    Renal/GU negative Renal ROS     Musculoskeletal  (+) Arthritis ,    Abdominal   Peds  Hematology negative hematology ROS (+) Lab Results      Component                Value               Date                      WBC                      7.4                 01/23/2023                HGB                      14.4                01/23/2023                HCT                      44.1                01/23/2023                MCV                      86.1                01/23/2023                PLT                      321                 01/23/2023              Anesthesia Other Findings Last Mounjaro: 01/20/2023  Reproductive/Obstetrics fibroids                             Anesthesia Physical Anesthesia Plan  ASA: 2  Anesthesia Plan: General   Post-op Pain Management: Tylenol PO (pre-op)*   Induction: Intravenous  PONV Risk Score and Plan: 3 and Ondansetron, Dexamethasone, Treatment may vary due to age or  medical condition, Midazolam and Scopolamine patch - Pre-op  Airway Management Planned: Oral ETT  Additional Equipment:   Intra-op Plan:   Post-operative Plan: Extubation in OR  Informed Consent: I have reviewed the patients History and Physical, chart, labs and discussed the procedure including the risks, benefits and alternatives for the proposed anesthesia with the patient or authorized representative who has indicated his/her understanding and acceptance.     Dental advisory given  Plan Discussed with: CRNA and Anesthesiologist  Anesthesia Plan Comments: (Risks of general anesthesia discussed including, but not limited to, sore throat, hoarse voice, chipped/damaged teeth, injury to vocal cords, nausea and vomiting, allergic reactions, lung infection, heart attack, stroke, and death. All questions answered. )       Anesthesia Quick Evaluation

## 2023-01-26 ENCOUNTER — Other Ambulatory Visit: Payer: Self-pay | Admitting: Obstetrics and Gynecology

## 2023-01-26 NOTE — H&P (Signed)
57 y.o. G3P2 with symptomatic fibroid uterus.  Previously:'GYN [place] for fibroids/tb//  Hx of fibroids in past- 2019 pelvic 8x6x4; EM 5.5 mm; 5 cm fibroid subserosal; normal ovaries and no ff.  Today Physician interpretation: 12x9x7, Em not seen secondary obscured. Ovaries not seen. Multiple fibroids biggest now 7 cm; some fibroids calcified./mah  Previously:"Pain started out on R but now across whole lower abd. Had MRI and CT of abdomen, pt did not get results about the uterus at this time.  Periods gone since ablation; not really having PMP sx, rare hot flashes.  Some pain with sex; not because of dryness, none.  Like a heaviness, gnawing pain, will wake her up at 2-3 am, Aleve helps but then will come back.  GI is completely normal. No splinting.  No urinary issues except for some frequency, no leak with urge. Occ SUI with laugh, not once a week. No prolapse sx. "  Pt now ready for therapy for fibroids- d/w again [place] and meds, pt desires hyst. Candidate for robotic but d/w pt may need to open to finish surgery if circumstances make robotics difficult."  Past Medical History:  Diagnosis Date   Allergy    Arthritis    knees   Diabetes mellitus without complication (HCC)    Type 2, follows w/ PCP, Dr. Virl Son   Fibroids    Hypertension    Follows w/ PCP, Dr. Virl Son.   Liver lesion    per 10/25/22 MRI in [facility] : inferior left lobe of liver, almost certainly a benign, slowly filling hemangioma   Wears contact lenses    Wears glasses    Past Surgical History:  Procedure Laterality Date   CESAREAN SECTION     2004 & 2007   COLONOSCOPY  10/12/2021   no polyps   ENDOMETRIAL ABLATION W/ NOVASURE N/A    around 2018   SIGMOIDOSCOPY     normal per pt    Social History   Socioeconomic History   Marital status: Married    Spouse name: Not on file   Number of children: 2   Years of education: Not on file   Highest education level: Not on file  Occupational History    Not on file  Tobacco Use   Smoking status: Never   Smokeless tobacco: Never  Vaping Use   Vaping status: Never Used  Substance and Sexual Activity   Alcohol use: Yes    Alcohol/week: 1.0 standard drink of alcohol    Types: 1 Glasses of wine per week    Comment: about 1/2 glass every 4 months   Drug use: Never   Sexual activity: Yes    Birth control/protection: None  Other Topics Concern   Not on file  Social History Narrative   Not on file   Social Determinants of Health   Financial Resource Strain: Not on file  Food Insecurity: Low Risk  (07/24/2022)   Received from [facility]   Hunger Vital Sign    Worried About Running Out of Food in the Last Year: Never true    Ran Out of Food in the Last Year: Never true  Transportation Needs: Not on file (07/24/2022)  Physical Activity: Not on file  Stress: Not on file  Social Connections: Not on file  Intimate Partner Violence: Not on file    No current facility-administered medications on file prior to encounter.   Current Outpatient Medications on File Prior to Encounter  Medication Sig Dispense Refill  Fluticasone Propionate (KLS ALLER-FLO NA) Place into the nose daily.     tirzepatide (MOUNJARO) 15 MG/0.5ML Pen Inject 15 mg into the skin once a week.     cetirizine (ZYRTEC) 10 MG chewable tablet Chew 10 mg by mouth daily.     cholecalciferol (VITAMIN D) 1000 UNITS tablet Take 2,000 Units by mouth 2 (two) times daily.     losartan (COZAAR) 25 MG tablet Take 25 mg by mouth daily.     metFORMIN (GLUCOPHAGE) 1000 MG tablet Take 500 mg by mouth daily at 12 noon.      No Known Allergies  Vitals:   01/17/23 1435  Weight: 83 kg  Height: 5\' 6"  (1.676 m)    Lungs: clear to ascultation Cor:  RRR Abdomen:  soft, nontender, nondistended. Ex:  no cords, erythema Pelvic:   Female Genitalia Vulva: no masses, no atrophy, no lesions Vagina: no tenderness, no erythema, no abnormal vaginal discharge, no vesicle(s) or ulcers, no  cystocele, no rectocele Cervix: grossly normal, no discharge, no cervical motion tenderness Uterus: enlarged, normal shape, midline, no uterine prolapse, non-tender Bladder/Urethra: normal meatus, no urethral discharge, no urethral mass, bladder non distended, Urethra well supported Adnexa/Parametria: no parametrial tenderness, no parametrial mass, no adnexal tenderness, no ovarian mass   A:  For robotic TLH, salpingectomies, cysto.   P: P: All risks, benefits and alternatives d/w patient and she desires to proceed.  Patient has undergone an ERAS protocol and will receive preop antibiotics and SCDs during the operation.   Pt to have extended recovery but will go home same day if eating, ambulating, voiding and pain control is good.  Loney Laurence

## 2023-01-27 ENCOUNTER — Encounter (HOSPITAL_BASED_OUTPATIENT_CLINIC_OR_DEPARTMENT_OTHER)
Admission: RE | Disposition: A | Discharge status: Home / Self Care | Payer: Self-pay | Source: Home / Self Care | Attending: Obstetrics and Gynecology

## 2023-01-27 ENCOUNTER — Ambulatory Visit (HOSPITAL_BASED_OUTPATIENT_CLINIC_OR_DEPARTMENT_OTHER): Payer: 59 | Admitting: Anesthesiology

## 2023-01-27 ENCOUNTER — Ambulatory Visit (HOSPITAL_BASED_OUTPATIENT_CLINIC_OR_DEPARTMENT_OTHER)
Admission: RE | Admit: 2023-01-27 | Discharge: 2023-01-27 | Disposition: A | Discharge status: Home / Self Care | Payer: 59 | Attending: Obstetrics and Gynecology | Admitting: Obstetrics and Gynecology

## 2023-01-27 ENCOUNTER — Encounter (HOSPITAL_BASED_OUTPATIENT_CLINIC_OR_DEPARTMENT_OTHER): Payer: Self-pay | Admitting: Obstetrics and Gynecology

## 2023-01-27 ENCOUNTER — Other Ambulatory Visit: Payer: Self-pay

## 2023-01-27 DIAGNOSIS — Z7984 Long term (current) use of oral hypoglycemic drugs: Secondary | ICD-10-CM | POA: Insufficient documentation

## 2023-01-27 DIAGNOSIS — E119 Type 2 diabetes mellitus without complications: Secondary | ICD-10-CM | POA: Diagnosis not present

## 2023-01-27 DIAGNOSIS — Z9889 Other specified postprocedural states: Secondary | ICD-10-CM

## 2023-01-27 DIAGNOSIS — Z79899 Other long term (current) drug therapy: Secondary | ICD-10-CM | POA: Diagnosis not present

## 2023-01-27 DIAGNOSIS — D259 Leiomyoma of uterus, unspecified: Secondary | ICD-10-CM

## 2023-01-27 DIAGNOSIS — N8003 Adenomyosis of the uterus: Secondary | ICD-10-CM | POA: Insufficient documentation

## 2023-01-27 DIAGNOSIS — Z01818 Encounter for other preprocedural examination: Secondary | ICD-10-CM

## 2023-01-27 DIAGNOSIS — N83202 Unspecified ovarian cyst, left side: Secondary | ICD-10-CM | POA: Insufficient documentation

## 2023-01-27 DIAGNOSIS — I10 Essential (primary) hypertension: Secondary | ICD-10-CM | POA: Insufficient documentation

## 2023-01-27 HISTORY — PX: ROBOTIC ASSISTED LAPAROSCOPIC OVARIAN CYSTECTOMY: SHX6081

## 2023-01-27 HISTORY — PX: CYSTOSCOPY: SHX5120

## 2023-01-27 HISTORY — DX: Presence of spectacles and contact lenses: Z97.3

## 2023-01-27 HISTORY — DX: Liver disease, unspecified: K76.9

## 2023-01-27 HISTORY — PX: ROBOTIC ASSISTED TOTAL HYSTERECTOMY WITH BILATERAL SALPINGO OOPHERECTOMY: SHX6086

## 2023-01-27 HISTORY — DX: Benign neoplasm of connective and other soft tissue, unspecified: D21.9

## 2023-01-27 LAB — TYPE AND SCREEN
ABO/RH(D): B POS
Antibody Screen: NEGATIVE

## 2023-01-27 LAB — POCT I-STAT, CHEM 8
BUN: 25 mg/dL — ABNORMAL HIGH (ref 6–20)
Calcium, Ion: 1.09 mmol/L — ABNORMAL LOW (ref 1.15–1.40)
Chloride: 105 mmol/L (ref 98–111)
Creatinine, Ser: 0.7 mg/dL (ref 0.44–1.00)
Glucose, Bld: 134 mg/dL — ABNORMAL HIGH (ref 70–99)
HCT: 45 % (ref 36.0–46.0)
Hemoglobin: 15.3 g/dL — ABNORMAL HIGH (ref 12.0–15.0)
Potassium: 5.3 mmol/L — ABNORMAL HIGH (ref 3.5–5.1)
Sodium: 137 mmol/L (ref 135–145)
TCO2: 23 mmol/L (ref 22–32)

## 2023-01-27 LAB — GLUCOSE, CAPILLARY: Glucose-Capillary: 154 mg/dL — ABNORMAL HIGH (ref 70–99)

## 2023-01-27 LAB — ABO/RH: ABO/RH(D): B POS

## 2023-01-27 SURGERY — HYSTERECTOMY, TOTAL, ROBOT-ASSISTED, LAPAROSCOPIC, WITH BILATERAL SALPINGO-OOPHORECTOMY
Anesthesia: General | Site: Abdomen

## 2023-01-27 MED ORDER — CEFAZOLIN SODIUM-DEXTROSE 2-4 GM/100ML-% IV SOLN
INTRAVENOUS | Status: AC
  Filled 2023-01-27: qty 100

## 2023-01-27 MED ORDER — ONDANSETRON HCL 4 MG/2ML IJ SOLN: 4.0000 mg | Freq: Four times a day (QID) | INTRAMUSCULAR | Status: DC | PRN

## 2023-01-27 MED ORDER — TRAMADOL HCL 50 MG PO TABS: 50.0000 mg | ORAL_TABLET | Freq: Four times a day (QID) | ORAL | Status: DC | PRN

## 2023-01-27 MED ORDER — MIDAZOLAM HCL 2 MG/2ML IJ SOLN
INTRAMUSCULAR | Status: DC | PRN
  Administered 2023-01-27: 2 mg via INTRAVENOUS

## 2023-01-27 MED ORDER — SUGAMMADEX SODIUM 200 MG/2ML IV SOLN
INTRAVENOUS | Status: DC | PRN
  Administered 2023-01-27: 180 mg via INTRAVENOUS

## 2023-01-27 MED ORDER — MIDAZOLAM HCL 2 MG/2ML IJ SOLN
INTRAMUSCULAR | Status: AC
Start: 2023-01-27 — End: ?
  Filled 2023-01-27: qty 2

## 2023-01-27 MED ORDER — ROCURONIUM BROMIDE 10 MG/ML (PF) SYRINGE
PREFILLED_SYRINGE | INTRAVENOUS | Status: DC | PRN
  Administered 2023-01-27 (×2): 10 mg via INTRAVENOUS
  Administered 2023-01-27: 60 mg via INTRAVENOUS

## 2023-01-27 MED ORDER — MENTHOL 3 MG MT LOZG: 1.0000 | LOZENGE | OROMUCOSAL | Status: DC | PRN

## 2023-01-27 MED ORDER — FLUTICASONE PROPIONATE 50 MCG/ACT NA SUSP: 1.0000 | Freq: Every day | NASAL | Status: DC

## 2023-01-27 MED ORDER — KETOROLAC TROMETHAMINE 30 MG/ML IJ SOLN
INTRAMUSCULAR | Status: AC
  Filled 2023-01-27: qty 1

## 2023-01-27 MED ORDER — DEXAMETHASONE SODIUM PHOSPHATE 10 MG/ML IJ SOLN
INTRAMUSCULAR | Status: AC
  Filled 2023-01-27: qty 1

## 2023-01-27 MED ORDER — CELECOXIB 200 MG PO CAPS
400.0000 mg | ORAL_CAPSULE | ORAL | Status: AC
  Administered 2023-01-27: 400 mg via ORAL

## 2023-01-27 MED ORDER — ACETAMINOPHEN 500 MG PO TABS
1000.0000 mg | ORAL_TABLET | ORAL | Status: AC
  Administered 2023-01-27: 1000 mg via ORAL

## 2023-01-27 MED ORDER — DEXMEDETOMIDINE HCL IN NACL 80 MCG/20ML IV SOLN
INTRAVENOUS | Status: AC
  Filled 2023-01-27: qty 20

## 2023-01-27 MED ORDER — FENTANYL CITRATE (PF) 100 MCG/2ML IJ SOLN
INTRAMUSCULAR | Status: AC
  Filled 2023-01-27: qty 2

## 2023-01-27 MED ORDER — GABAPENTIN 300 MG PO CAPS
300.0000 mg | ORAL_CAPSULE | ORAL | Status: AC
  Administered 2023-01-27: 300 mg via ORAL

## 2023-01-27 MED ORDER — SOD CITRATE-CITRIC ACID 500-334 MG/5ML PO SOLN: 30.0000 mL | ORAL | Status: DC

## 2023-01-27 MED ORDER — PROPOFOL 10 MG/ML IV BOLUS
INTRAVENOUS | Status: AC
  Filled 2023-01-27: qty 20

## 2023-01-27 MED ORDER — ROCURONIUM BROMIDE 10 MG/ML (PF) SYRINGE
PREFILLED_SYRINGE | INTRAVENOUS | Status: AC
  Filled 2023-01-27: qty 10

## 2023-01-27 MED ORDER — IBUPROFEN 200 MG PO TABS: 600.0000 mg | ORAL_TABLET | Freq: Four times a day (QID) | ORAL | Status: DC

## 2023-01-27 MED ORDER — LIDOCAINE 2% (20 MG/ML) 5 ML SYRINGE
INTRAMUSCULAR | Status: DC | PRN
  Administered 2023-01-27: 60 mg via INTRAVENOUS

## 2023-01-27 MED ORDER — PROPOFOL 10 MG/ML IV BOLUS
INTRAVENOUS | Status: DC | PRN
  Administered 2023-01-27: 150 mg via INTRAVENOUS

## 2023-01-27 MED ORDER — ONDANSETRON HCL 4 MG/2ML IJ SOLN
INTRAMUSCULAR | Status: AC
  Filled 2023-01-27: qty 2

## 2023-01-27 MED ORDER — ONDANSETRON HCL 4 MG/2ML IJ SOLN
INTRAMUSCULAR | Status: DC | PRN
  Administered 2023-01-27: 4 mg via INTRAVENOUS

## 2023-01-27 MED ORDER — SODIUM CHLORIDE 0.9 % IR SOLN
Status: DC | PRN
  Administered 2023-01-27: 700 mL

## 2023-01-27 MED ORDER — DEXAMETHASONE SODIUM PHOSPHATE 10 MG/ML IJ SOLN
INTRAMUSCULAR | Status: DC | PRN
  Administered 2023-01-27: 5 mg via INTRAVENOUS

## 2023-01-27 MED ORDER — CEFAZOLIN SODIUM-DEXTROSE 2-4 GM/100ML-% IV SOLN
2.0000 g | INTRAVENOUS | Status: AC
  Administered 2023-01-27: 2 g via INTRAVENOUS

## 2023-01-27 MED ORDER — SCOPOLAMINE 1 MG/3DAYS TD PT72
MEDICATED_PATCH | TRANSDERMAL | Status: AC
  Filled 2023-01-27: qty 1

## 2023-01-27 MED ORDER — CELECOXIB 200 MG PO CAPS
ORAL_CAPSULE | ORAL | Status: AC
  Filled 2023-01-27: qty 2

## 2023-01-27 MED ORDER — GABAPENTIN 300 MG PO CAPS
ORAL_CAPSULE | ORAL | Status: AC
  Filled 2023-01-27: qty 1

## 2023-01-27 MED ORDER — LOSARTAN POTASSIUM 25 MG PO TABS
25.0000 mg | ORAL_TABLET | Freq: Every day | ORAL | Status: DC
  Filled 2023-01-27: qty 1

## 2023-01-27 MED ORDER — OXYCODONE-ACETAMINOPHEN 5-325 MG PO TABS
1.0000 | ORAL_TABLET | ORAL | Status: DC | PRN
  Administered 2023-01-27: 2 via ORAL

## 2023-01-27 MED ORDER — SCOPOLAMINE 1 MG/3DAYS TD PT72
1.0000 | MEDICATED_PATCH | TRANSDERMAL | Status: DC
  Administered 2023-01-27: 1.5 mg via TRANSDERMAL

## 2023-01-27 MED ORDER — HYDROMORPHONE HCL 1 MG/ML IJ SOLN
INTRAMUSCULAR | Status: AC
  Filled 2023-01-27: qty 1

## 2023-01-27 MED ORDER — FLUORESCEIN SODIUM 10 % IV SOLN
INTRAVENOUS | Status: DC | PRN
  Administered 2023-01-27: 50 mg via INTRAVENOUS

## 2023-01-27 MED ORDER — ONDANSETRON HCL 4 MG PO TABS: 4.0000 mg | ORAL_TABLET | Freq: Four times a day (QID) | ORAL | Status: DC | PRN

## 2023-01-27 MED ORDER — DOCUSATE SODIUM 100 MG PO CAPS
100.0000 mg | ORAL_CAPSULE | Freq: Two times a day (BID) | ORAL | Status: DC
Start: 2023-01-27 — End: 2023-01-27
  Administered 2023-01-27: 100 mg via ORAL

## 2023-01-27 MED ORDER — OXYCODONE HCL 5 MG PO TABS: 5.0000 mg | ORAL_TABLET | Freq: Once | ORAL | Status: DC | PRN

## 2023-01-27 MED ORDER — DOCUSATE SODIUM 100 MG PO CAPS
ORAL_CAPSULE | ORAL | Status: AC
  Filled 2023-01-27: qty 1

## 2023-01-27 MED ORDER — LACTATED RINGERS IV SOLN: INTRAVENOUS | Status: DC

## 2023-01-27 MED ORDER — FENTANYL CITRATE (PF) 100 MCG/2ML IJ SOLN
25.0000 ug | INTRAMUSCULAR | Status: DC | PRN
  Administered 2023-01-27 (×2): 50 ug via INTRAVENOUS

## 2023-01-27 MED ORDER — FLUORESCEIN SODIUM 10 % IV SOLN
INTRAVENOUS | Status: AC
  Filled 2023-01-27: qty 5

## 2023-01-27 MED ORDER — LIDOCAINE HCL (PF) 2 % IJ SOLN
INTRAMUSCULAR | Status: AC
  Filled 2023-01-27: qty 5

## 2023-01-27 MED ORDER — STERILE WATER FOR IRRIGATION IR SOLN
Status: DC | PRN
  Administered 2023-01-27: 500 mL

## 2023-01-27 MED ORDER — AMISULPRIDE (ANTIEMETIC) 5 MG/2ML IV SOLN: 10.0000 mg | Freq: Once | INTRAVENOUS | Status: DC | PRN

## 2023-01-27 MED ORDER — OXYCODONE-ACETAMINOPHEN 5-325 MG PO TABS
ORAL_TABLET | ORAL | Status: AC
  Filled 2023-01-27: qty 2

## 2023-01-27 MED ORDER — POVIDONE-IODINE 10 % EX SWAB: 2.0000 | Freq: Once | CUTANEOUS | Status: DC

## 2023-01-27 MED ORDER — DEXMEDETOMIDINE HCL IN NACL 80 MCG/20ML IV SOLN
INTRAVENOUS | Status: DC | PRN
  Administered 2023-01-27 (×2): 8 ug via INTRAVENOUS

## 2023-01-27 MED ORDER — FENTANYL CITRATE (PF) 100 MCG/2ML IJ SOLN
INTRAMUSCULAR | Status: DC | PRN
  Administered 2023-01-27 (×2): 25 ug via INTRAVENOUS
  Administered 2023-01-27 (×2): 50 ug via INTRAVENOUS

## 2023-01-27 MED ORDER — LORATADINE 10 MG PO TABS: 10.0000 mg | ORAL_TABLET | Freq: Every day | ORAL | Status: DC

## 2023-01-27 MED ORDER — BUPIVACAINE HCL (PF) 0.25 % IJ SOLN
INTRAMUSCULAR | Status: DC | PRN
  Administered 2023-01-27: 10 mL

## 2023-01-27 MED ORDER — HYDROMORPHONE HCL 1 MG/ML IJ SOLN
1.0000 mg | Freq: Once | INTRAMUSCULAR | Status: AC
  Administered 2023-01-27: 1 mg via INTRAVENOUS

## 2023-01-27 MED ORDER — OXYCODONE HCL 5 MG/5ML PO SOLN: 5.0000 mg | Freq: Once | ORAL | Status: DC | PRN

## 2023-01-27 MED ORDER — ACETAMINOPHEN 500 MG PO TABS
ORAL_TABLET | ORAL | Status: AC
  Filled 2023-01-27: qty 2

## 2023-01-27 MED ORDER — SODIUM CHLORIDE 0.9 % IV SOLN
INTRAVENOUS | Status: DC | PRN
  Administered 2023-01-27: 60 mL

## 2023-01-27 SURGICAL SUPPLY — 62 items
ADH SKN CLS APL DERMABOND .7 (GAUZE/BANDAGES/DRESSINGS) ×3
APL SWBSTK 6 STRL LF DISP (MISCELLANEOUS) ×3
APPLICATOR COTTON TIP 6 STRL (MISCELLANEOUS) IMPLANT
APPLICATOR COTTON TIP 6IN STRL (MISCELLANEOUS) ×3
BAG DECANTER FOR FLEXI CONT (MISCELLANEOUS) IMPLANT
COVER BACK TABLE 60X90IN (DRAPES) ×4 IMPLANT
COVER TIP SHEARS 8 DVNC (MISCELLANEOUS) ×4 IMPLANT
DEFOGGER SCOPE WARMER CLEARIFY (MISCELLANEOUS) ×4 IMPLANT
DERMABOND ADVANCED .7 DNX12 (GAUZE/BANDAGES/DRESSINGS) ×4 IMPLANT
DRAPE ARM DVNC X/XI (DISPOSABLE) ×16 IMPLANT
DRAPE COLUMN DVNC XI (DISPOSABLE) ×4 IMPLANT
DRAPE SURG IRRIG POUCH 19X23 (DRAPES) ×4 IMPLANT
DRAPE UTILITY XL STRL (DRAPES) ×4 IMPLANT
DRIVER NDL MEGA SUTCUT DVNCXI (INSTRUMENTS) ×4 IMPLANT
DRIVER NDLE MEGA SUTCUT DVNCXI (INSTRUMENTS) ×3 IMPLANT
DURAPREP 26ML APPLICATOR (WOUND CARE) ×4 IMPLANT
ELECT REM PT RETURN 9FT ADLT (ELECTROSURGICAL) ×3
ELECTRODE REM PT RTRN 9FT ADLT (ELECTROSURGICAL) ×4 IMPLANT
FORCEPS BPLR FENES DVNC XI (FORCEP) ×4 IMPLANT
GAUZE 4X4 16PLY ~~LOC~~+RFID DBL (SPONGE) IMPLANT
GLOVE BIO SURGEON STRL SZ 6 (GLOVE) IMPLANT
GLOVE BIO SURGEON STRL SZ7 (GLOVE) ×12 IMPLANT
GLOVE BIOGEL PI IND STRL 6 (GLOVE) IMPLANT
GLOVE BIOGEL PI IND STRL 6.5 (GLOVE) IMPLANT
GLOVE BIOGEL PI IND STRL 7.0 (GLOVE) IMPLANT
GLOVE SURG SS PI 7.0 STRL IVOR (GLOVE) IMPLANT
GLOVE SURG SYN 6.5 ES PF (GLOVE) ×9 IMPLANT
GLOVE SURG SYN 6.5 PF PI (GLOVE) IMPLANT
GOWN STRL REUS W/TWL LRG LVL3 (GOWN DISPOSABLE) IMPLANT
HOLDER FOLEY CATH W/STRAP (MISCELLANEOUS) IMPLANT
IRRIG SUCT STRYKERFLOW 2 WTIP (MISCELLANEOUS) ×3
IRRIGATION SUCT STRKRFLW 2 WTP (MISCELLANEOUS) ×4 IMPLANT
IV NS 1000ML (IV SOLUTION) ×3
IV NS 1000ML BAXH (IV SOLUTION) IMPLANT
KIT PINK PAD W/HEAD ARE REST (MISCELLANEOUS) ×3
KIT PINK PAD W/HEAD ARM REST (MISCELLANEOUS) ×4 IMPLANT
KIT TURNOVER CYSTO (KITS) ×4 IMPLANT
LEGGING LITHOTOMY PAIR STRL (DRAPES) ×4 IMPLANT
MANIPULATOR ADVINCU DEL 2.5 PL (MISCELLANEOUS) IMPLANT
MANIPULATOR ADVINCU DEL 3.0 PL (MISCELLANEOUS) IMPLANT
NDL INSUFFLATION 14GA 120MM (NEEDLE) ×4 IMPLANT
NEEDLE INSUFFLATION 14GA 120MM (NEEDLE) ×3 IMPLANT
OBTURATOR OPTICAL STND 8 DVNC (TROCAR) ×3
OBTURATOR OPTICALSTD 8 DVNC (TROCAR) ×4 IMPLANT
PACK ROBOT WH (CUSTOM PROCEDURE TRAY) ×4 IMPLANT
PACK ROBOTIC GOWN (GOWN DISPOSABLE) ×4 IMPLANT
PAD OB MATERNITY 4.3X12.25 (PERSONAL CARE ITEMS) ×4 IMPLANT
PAD PREP 24X48 CUFFED NSTRL (MISCELLANEOUS) ×4 IMPLANT
PROTECTOR NERVE ULNAR (MISCELLANEOUS) ×8 IMPLANT
SCISSORS MNPLR CVD DVNC XI (INSTRUMENTS) ×4 IMPLANT
SEAL UNIV 5-12 XI (MISCELLANEOUS) ×12 IMPLANT
SET IRRIG Y TYPE TUR BLADDER L (SET/KITS/TRAYS/PACK) ×4 IMPLANT
SET TRI-LUMEN FLTR TB AIRSEAL (TUBING) ×4 IMPLANT
SLEEVE SCD COMPRESS KNEE MED (STOCKING) ×4 IMPLANT
SPIKE FLUID TRANSFER (MISCELLANEOUS) ×4 IMPLANT
SUT VIC AB 0 CT1 36 (SUTURE) ×4 IMPLANT
SUT VICRYL RAPIDE 3 0 (SUTURE) ×8 IMPLANT
SUT VLOC 180 0 9IN GS21 (SUTURE) ×4 IMPLANT
TOWEL OR 17X24 6PK STRL BLUE (TOWEL DISPOSABLE) ×4 IMPLANT
TRAY FOLEY W/BAG SLVR 14FR LF (SET/KITS/TRAYS/PACK) ×4 IMPLANT
TROCAR PORT AIRSEAL 5X120 (TROCAR) ×4 IMPLANT
WATER STERILE IRR 500ML POUR (IV SOLUTION) IMPLANT

## 2023-01-27 NOTE — Discharge Summary (Addendum)
Physician Discharge Summary  Patient ID: Holly Diaz MRN: [id number] DOB/AGE: 1965/10/30 57 y.o.  Admit date: 01/27/2023 Discharge date: 01/27/2023  Admission Diagnoses:symptomatic fibroid uterus  Discharge Diagnoses:  Principal Problem:   Post-operative state   Discharged Condition: good  Hospital Course: Uncomplicated robotic TLH, salpingectomies   Consults: None  Significant Diagnostic Studies: none  Treatments: surgery: Uncomplicated robotic TLH, salpingectomies   Discharge Exam: Blood pressure 129/81, pulse 90, temperature 98 F (36.7 C), temperature source Oral, resp. rate 16, height 5\' 6"  (1.676 m), weight 83 kg, last menstrual period 08/17/2015, SpO2 99%.  Disposition: Discharge disposition: 01-Home or Self Care       Discharge Instructions     Call MD for:  temperature >100.4   Complete by: As directed    Diet - low sodium heart healthy   Complete by: As directed    Discharge instructions   Complete by: As directed    No driving on narcotics, no sexual activity for 2 weeks.   Increase activity slowly   Complete by: As directed    May shower / Bathe   Complete by: As directed    Shower, no bath for 2 weeks.   Remove dressing in 24 hours   Complete by: As directed    Sexual Activity Restrictions   Complete by: As directed    No sexual activity for 2 weeks.      Allergies as of 01/27/2023   No Known Allergies      Medication List     TAKE these medications    cetirizine 10 MG chewable tablet Commonly known as: ZYRTEC Chew 10 mg by mouth daily.   cholecalciferol 1000 units tablet Commonly known as: VITAMIN D Take 2,000 Units by mouth 2 (two) times daily.   KLS ALLER-FLO NA Place into the nose daily.   losartan 25 MG tablet Commonly known as: COZAAR Take 25 mg by mouth daily.   metFORMIN 1000 MG tablet Commonly known as: GLUCOPHAGE Take 500 mg by mouth daily at 12 noon.   Mounjaro 15 MG/0.5ML Pen Generic drug:  tirzepatide Inject 15 mg into the skin once a week.       Tramadol and ibuprofen sent from office. Percocet sent from office.  Signed: Loney Laurence 01/27/2023, 10:04 AM

## 2023-01-27 NOTE — Anesthesia Postprocedure Evaluation (Signed)
Anesthesia Post Note  Patient: Holly Diaz  Procedure(s) Performed: XI ROBOTIC ASSISTED TOTAL HYSTERECTOMY WITH BILATERAL SALPINGECTOMIES (Abdomen) CYSTOSCOPY XI ROBOTIC ASSISTED LAPAROSCOPIC OVARIAN CYSTECTOMY (Left: Abdomen)     Patient location during evaluation: PACU Anesthesia Type: General Level of consciousness: awake Pain management: pain level controlled Vital Signs Assessment: post-procedure vital signs reviewed and stable Respiratory status: spontaneous breathing, nonlabored ventilation and respiratory function stable Cardiovascular status: blood pressure returned to baseline and stable Postop Assessment: no apparent nausea or vomiting Anesthetic complications: no   No notable events documented.  Last Vitals:  Vitals:   01/27/23 1140 01/27/23 1230  BP: 108/60 107/64  Pulse: 74 88  Resp: 18 16  Temp:    SpO2: 98% 100%    Last Pain:  Vitals:   01/27/23 1230  TempSrc:   PainSc: 6                  Linton Rump

## 2023-01-27 NOTE — Anesthesia Procedure Notes (Signed)
Procedure Name: Intubation Date/Time: 01/27/2023 7:39 AM  Performed by: Francie Massing, CRNAPre-anesthesia Checklist: Patient identified, Emergency Drugs available, Suction available and Patient being monitored Patient Re-evaluated:Patient Re-evaluated prior to induction Oxygen Delivery Method: Circle system utilized Preoxygenation: Pre-oxygenation with 100% oxygen Induction Type: IV induction Ventilation: Mask ventilation without difficulty Laryngoscope Size: Mac and 3 Grade View: Grade I Tube type: Oral Tube size: 7.0 mm Number of attempts: 1 Airway Equipment and Method: Stylet and Oral airway Placement Confirmation: ETT inserted through vocal cords under direct vision, positive ETCO2 and breath sounds checked- equal and bilateral Secured at: 22 cm Tube secured with: Tape Dental Injury: Teeth and Oropharynx as per pre-operative assessment

## 2023-01-27 NOTE — Op Note (Signed)
01/27/2023  9:53 AM  PATIENT:  Holly Diaz  57 y.o. female  PRE-OPERATIVE DIAGNOSIS:  leiomyoma of uterus  POST-OPERATIVE DIAGNOSIS:  leiomyoma of uterus  PROCEDURE:  Procedure(s): XI ROBOTIC ASSISTED TOTAL HYSTERECTOMY WITH BILATERAL SALPINGECTOMIES (N/A) CYSTOSCOPY (N/A) XI ROBOTIC ASSISTED LAPAROSCOPIC OVARIAN CYSTECTOMY (Left)  SURGEON:  Surgeons and Role:    * Carrington Clamp, MD - Primary    * Charlett Nose, MD - Assisting  EBL:  150 mL   DRAINS: Urinary Catheter (Foley)   LOCAL MEDICATIONS USED:  OTHER marcaine and ropivicaine  SPECIMEN:  Source of Specimen:  Uterus, cervix, bilateral tubes, partial L ovarian cyst  DISPOSITION OF SPECIMEN:  PATHOLOGY  COUNTS:  YES  TOURNIQUET:  * No tourniquets in log *  DICTATION: .Note written in EPIC  PLAN OF CARE: Admit for overnight observation  PATIENT DISPOSITION:  PACU - hemodynamically stable.   Delay start of Pharmacological VTE agent (>24hrs) due to surgical blood loss or risk of bleeding: not applicable  Complications:  None.  Findings:  14 weeks size uterus.  Ovaries were normal but there was a 4 cm clear cyst on the L with significant endometriosis on the mesosalpinx and a clubbed L tube.  In order to remove all of the endo, a partial L cystectomy was done.  The ureters were identified during multiple points of the case and were always out of the field of dissection.  On cystoscopy, the bladder was intact and bilateral spill was seen from each ureteral oriface.    Medications:  Ancef.  Ropivicaine and marcaine.      Technique:   After adequate anesthesia was achieved the patient was positioned, prepped and draped in usual sterile fashion.  A speculum was placed in the vagina and the cervix dilated with pratt dilators.  The 3 cm Koh ring Advincula was assembled and placed in proper fashion.  The  Speculum was removed and the bladder catheterized with a foley.     Attention was turned to the  abdomen where a 1 cm incision was made 1 cm above the umbilicus.  The veress needle was introduced without aspiration of bowel contents or blood and the abdomen insufflated. The 8.5 mm Robotic trocar was placed and the other three trocar sites were marked out, all approximately 10 cm from each other and the camera.  Two 8.5 mm trocars were placed on either side of the camera port and a 5 mm assistant port was placed 3 cm above the line of the other trocars.  All trocars were inserted under direct visualization of the camera.  The patient was placed in trendelenburg and then the Robot docked.  The fenestrated bipolar were placed on arm 1 and the Hot shears on arm 3 and introduced under direct visualization of the camera.   I then broke scrub and sat down at the console.  The above findings were noted and the ureters identified well out of the field of dissection.  The right fallopian tube was isolated and cauterized with the bipolar.  The Utero-ovarian ligament was then divided with the bipolar cautery and shears.  The posterior broad ligament was then divided with the hot shears until the uterosacral ligament.  The Broad and cardinal ligaments were then cauterized against the cervix to the level of the Koh ring, securing the uterine artery.  Each pedicle was then incised with the shears.  The anterior leaf was then incised at the reflection of the vessico-uterine junction and the lateral bladder  retracted inferiorly after the round ligament had been divided with the bipolar forceps.  Since there was a 4 cm clear cyst on the L with significant endometriosis on the mesosalpinx extending to the cyst and a clubbed L tube that was also on the cyst, about 3 cm of the cyst was amputated in order to remove all of the endo. The left tube was cauterized with the bipolar and divided with the shears;  then the left utero-ovarian ligament divided with the bipolar forceps and the scissors.  The round ligament was divided as  well and the posterior leaf of the broad ligament then divided with the hot shears. The broad and cardinal ligaments were then cauterized on the left in the same way.   At the level of the internal os, the uterine arteries were bilaterally cauterized with the bipolar.  The ureters were identified well out of the field of dissection.     The bladder was then able to be retracted inferiorly and the vesico-uterine fascia was incised in the midline until the bladder was removed one cm below the Koh ring.  The hot shears then circumferentially incised the vagina at the level of the reflection on the [facility] ring.  Once the uterus and cervix were amputated, cautery was used to insure hemostasis of the cuff.  Once hemostasis was achieved, the scissors were changed to the mega suture cut needle driver and the cuff was closed with a running stitches of 0-vicryl V loc.  Cautery was used to ensure hemostasis of the left pedicles very superficially. The ureters were peristalsing bilaterally well and very lateral to the areas of operation.     The Robot was then undocked and I scrubbed back in.  The needle was removed and Ropivicaine was introduced into the pelvis. The skin incisions were closed with subcuticular stitches of 3-0 vicryl Rapide and Dermabond.  Marcaine was injected into the incisions. All instruments were removed from the vagina and cystoscopy performed, revealing an intact bladder and vigourous spill of urine from each ureteral orifice.  The cystoscope was removed and the patient taken to the recovery room in stable condition.   Faaris Arizpe A

## 2023-01-27 NOTE — Progress Notes (Signed)
There has been no change in the patients history, status or exam since the history and physical.  Vitals:   01/17/23 1435 01/27/23 0605  BP:  129/81  Pulse:  90  Resp:  16  Temp:  98 F (36.7 C)  TempSrc:  Oral  SpO2:  99%  Weight: 83 kg 83 kg  Height: 5\' 6"  (1.676 m) 5\' 6"  (1.676 m)    Results for orders placed or performed during the hospital encounter of 01/27/23 (from the past 72 hour(s))  I-STAT, chem 8     Status: Abnormal   Collection Time: 01/27/23  6:48 AM  Result Value Ref Range   Sodium 137 135 - 145 mmol/L   Potassium 5.3 (H) 3.5 - 5.1 mmol/L   Chloride 105 98 - 111 mmol/L   BUN 25 (H) 6 - 20 mg/dL   Creatinine, Ser 5.57 0.44 - 1.00 mg/dL   Glucose, Bld 322 (H) 70 - 99 mg/dL    Comment: Glucose reference range applies only to samples taken after fasting for at least 8 hours.   Calcium, Ion 1.09 (L) 1.15 - 1.40 mmol/L   TCO2 23 22 - 32 mmol/L   Hemoglobin 15.3 (H) 12.0 - 15.0 g/dL   HCT 02.5 42.7 - 06.2 %  H/H 15/45  Loney Laurence

## 2023-01-27 NOTE — Transfer of Care (Signed)
Immediate Anesthesia Transfer of Care Note  Patient: Holly Diaz  Procedure(s) Performed: Procedure(s) (LRB): XI ROBOTIC ASSISTED TOTAL HYSTERECTOMY WITH BILATERAL SALPINGECTOMIES (N/A) CYSTOSCOPY (N/A) XI ROBOTIC ASSISTED LAPAROSCOPIC OVARIAN CYSTECTOMY (Left)  Patient Location: PACU  Anesthesia Type: General  Level of Consciousness: awake, oriented, sedated and patient cooperative  Airway & Oxygen Therapy: Patient Spontanous Breathing and Patient connected to face mask oxygen  Post-op Assessment: Report given to PACU RN and Post -op Vital signs reviewed and stable  Post vital signs: Reviewed and stable  Complications: No apparent anesthesia complications  Last Vitals:  Vitals Value Taken Time  BP    Temp    Pulse    Resp    SpO2      Last Pain:  Vitals:   01/27/23 0605  TempSrc: Oral  PainSc: 3       Patients Stated Pain Goal: 3 (01/27/23 2703)  Complications: No notable events documented.

## 2023-01-27 NOTE — Brief Op Note (Signed)
01/27/2023  9:53 AM  PATIENT:  Holly Diaz  57 y.o. female  PRE-OPERATIVE DIAGNOSIS:  leiomyoma of uterus  POST-OPERATIVE DIAGNOSIS:  leiomyoma of uterus  PROCEDURE:  Procedure(s): XI ROBOTIC ASSISTED TOTAL HYSTERECTOMY WITH BILATERAL SALPINGECTOMIES (N/A) CYSTOSCOPY (N/A) XI ROBOTIC ASSISTED LAPAROSCOPIC OVARIAN CYSTECTOMY (Left)  SURGEON:  Surgeons and Role:    * Carrington Clamp, MD - Primary    * Charlett Nose, MD - Assisting  EBL:  150 mL   DRAINS: Urinary Catheter (Foley)   LOCAL MEDICATIONS USED:  OTHER marcaine and ropivicaine  SPECIMEN:  Source of Specimen:  Uterus, cervix, bilateral tubes, partial L ovarian cyst  DISPOSITION OF SPECIMEN:  PATHOLOGY  COUNTS:  YES  TOURNIQUET:  * No tourniquets in log *  DICTATION: .Note written in EPIC  PLAN OF CARE: Admit for overnight observation  PATIENT DISPOSITION:  PACU - hemodynamically stable.   Delay start of Pharmacological VTE agent (>24hrs) due to surgical blood loss or risk of bleeding: not applicable

## 2023-01-28 LAB — SURGICAL PATHOLOGY

## 2023-01-29 ENCOUNTER — Encounter (HOSPITAL_BASED_OUTPATIENT_CLINIC_OR_DEPARTMENT_OTHER): Payer: Self-pay | Admitting: Obstetrics and Gynecology

## 2023-03-04 ENCOUNTER — Other Ambulatory Visit: Payer: Self-pay | Admitting: Family Medicine

## 2023-03-04 DIAGNOSIS — Z1231 Encounter for screening mammogram for malignant neoplasm of breast: Secondary | ICD-10-CM

## 2023-04-04 ENCOUNTER — Ambulatory Visit
Admission: RE | Admit: 2023-04-04 | Discharge: 2023-04-04 | Disposition: A | Discharge status: Home / Self Care | Payer: 59 | Source: Ambulatory Visit | Attending: Family Medicine | Admitting: Family Medicine

## 2023-04-04 DIAGNOSIS — Z1231 Encounter for screening mammogram for malignant neoplasm of breast: Secondary | ICD-10-CM

## 2024-02-20 NOTE — Progress Notes (Signed)
 " [address] - AMBULATORY [facility]  - [facility] [address] [place] [address]    Date of service:  02/20/2024  Name:  Holly Diaz  Date of Birth:  [birth year]    SUBJECTIVE   PatientID: Holly Diaz is a 58 y.o. (DOB 16-Jul-1965) female  Chief Complaint  Patient presents with   Annual Exam   Follow-up   Hypertension   Diabetes   Hyperlipidemia    The patient was last seen 07/17/23.   Hypertension:  Pt is taking medications as instructed, no medication side effects noted.  Home blood pressure monitoring:  Yes, occasionally. Exercise:  Yes, some walking at work.  ROS negative for exertional chest pain, lower extremity edema, palpitations, or decrease in exercise tolerance.  Diabetes  Current symptoms/problems include: none. Known diabetic complications: none.     Last eye exam: Dr. Othelia [facility]).  Foot exam:  DM foot exam performed.   Current diet: healthy diet. FSBS  yes, occasionally.  Ranges: 120-140.  Statin:  no  The patient has had a diabetic education consultation.    The patient has had pneumococcal vaccination.    Dyslipidemia  Currently not on any medication. Controlled with diet and exercise.  She has regained some weight, on maximum dose of Mounjaro, having menopausal symptoms likely contributing.  The 10-year ASCVD risk score (Arnett DK, et al., 2019) is: 6.9%   Values used to calculate the score:     Age: 26 years     Clinically relevant sex: Female     Is Non-Hispanic African American: No     Diabetic: Yes     Tobacco smoker: No     Systolic Blood Pressure: 126 mmHg     Is BP treated: Yes     HDL Cholesterol: 45 mg/dL     Total Cholesterol: 173 mg/dL  BP Readings from Last 3 Encounters:  02/20/24 126/80  07/17/23 122/84  02/17/23 118/76                        Wt Readings from Last 3 Encounters:  02/20/24 83.5 kg (184 lb)   07/17/23 80.6 kg (177 lb 9.6 oz)  02/17/23 81.1 kg (178 lb 12.8 oz)      Lab Results  Component Value Date   CHOL 173 02/10/2024   CHOL 148 02/14/2023   CHOL 154 01/29/2022   Lab Results  Component Value Date   HDL 45 (L) 02/10/2024   HDL 32 (L) 02/14/2023   HDL 50 01/29/2022   Lab Results  Component Value Date   LDLCALC 112 (H) 02/10/2024   LDLCALC 101 (H) 02/14/2023   Lab Results  Component Value Date   TRIG 73 02/10/2024   TRIG 63 02/14/2023   TRIG 43 01/29/2022   No results found for: CHOLHDL     Lab Results  Component Value Date   HGBA1C 6.8 (H) 02/10/2024   POCA1C 6.3 (A) 07/17/2023     Lab Results  Component Value Date   CREATININE 0.73 02/10/2024    Treatment goals reviewed.    ROS All other pertinent systems reviewed and are negative.   Social History[1]  The following portions of the patient's history were reviewed and updated asappropriate: allergies, current medications, past family history, past medical history, past social history, past surgical history and problem list.  OBJECTIVE   Vitals:   02/20/24 1600  BP:  126/80  BP Location: Left arm  Patient Position: Sitting  Pulse: 89  Resp: 16  Temp: 97.6 F (36.4 C)  TempSrc: Temporal  SpO2: 98%  Weight: 83.5 kg (184 lb)  Height: 1.638 m (5' 4.5)    Body mass index is 31.1 kg/m.  Constitutional: Alert - NAD. Appears well-developed and well nourished. Cardiovascular: Normal rate and rhythm.  Exam reveals no gallop and nofriction rub.  No murmur heard.  Pulmonary/chest: Effort normal and breath sounds normal. No wheezes. No rales.  Diabetic Foot Exam Performed: Yes Diabetic Foot Exam Result:  [x]  Normal The patient's foot has been visually inspected with no ulcers, deformities, or abnormal toenails.  The patient can see the bottom of his/her feet and shoes fit properly. The patient can feel the 10g nylon filament on both feet for the 1st, 3rd, 5th metatarsals and big toes.   The patient's pedal pulses are palpable on both feet at dorsalis pedis and/or posterior tibial.   ASSESSMENT/PLAN   Problem List Items Addressed This Visit     Dyslipidemia, goal LDL below 100   Lipids not in desired range when last checked Diet low in refined carbohydrates, sugars and unhealthy fats advised.       Essential hypertension   Controlled, BP at goal. Continue current management.       Type 2 diabetes mellitus without complication, without long-term current use of insulin    (CMD)   A1c at goal though higher than previously Patient inquires about adding Jardiance back but advise continuing metformin XR and Mounjaro, any tweaks in diet where applicable though patient is not eating a lot, reassess in 3 months. Diet low in carbohydrates and sugars advised along with regular exercise.       Vitamin B 12 deficiency   Relevant Medications   cyanocobalamin (VITAMIN B12) injection 1,000 mcg (Completed) (Start on 02/20/2024  5:30 PM)   Other Visit Diagnoses       Annual physical exam    -  Primary     Depression screen         Risks, benefits,and alternatives of the medication(s) and treatment plan(s) were discussed, and she expressed understanding.  No barriers to treatment identified in this visit.    Return in about 1 year (around 02/19/2025) for annual physical, 3 months , follow up on chronic conditions.  Current Outpatient Medications  Medication Instructions   cetirizine (ZYRTEC) 10 mg, Daily   cholecalciferol (VITAMIN D3) 1,000 unit (25 mcg) tablet 2 tablets, 2 times daily   clindamycin (CLEOCIN T) 1 % lotion APPLY TOPICALLY TO THE AFFECTED AREA TWICE DAILY   hydrocortisone 2.5 % ointment    Lancets misc Accu-Chek Softclix Lancets   losartan  (COZAAR ) 25 mg, oral, Daily, for blood pressure   metFORMIN (GLUCOPHAGE-XR) 1,000 mg, oral, Daily with breakfast   Mounjaro 15 mg, subcutaneous, Every 7 days   naproxen sodium (ANAPROX) 220 mg tablet Take by mouth.    OneTouch Ultra Test test strip FSBS daily and as needed.   red yeast rice 600 mg, 2 times daily   tretinoin (Retin-A) 0.01 % gel    triamcinolone (KENALOG) 0.1 % cream       This document serves as a record of services personally performed by Madelin Brought, MD.  It was created on their behalf by Rolin LOISE Ka, CMA, a trained medical scribe, and Certified Medical Assistant (CMA). During the course of documenting the history, physical exam and medical decision making, I was functioning as a  medical scribe. The creation of this record is the providers dictation and/or activities during the visit.  Electronically signed by Rolin LOISE Ka, CMA 02/20/2024 8:41 AM    I agree the documentation is accurate and complete.  Electronically signed by: Madelin Rachel Brought, MD 02/20/2024 5:07 PM         [1] Social History Tobacco Use   Smoking status: Never    Passive exposure: Never   Smokeless tobacco: Never  Vaping Use   Vaping status: Never Used  Substance Use Topics   Alcohol use: No   Drug use: No  "

## 2024-03-17 ENCOUNTER — Ambulatory Visit

## 2024-03-17 ENCOUNTER — Other Ambulatory Visit: Payer: Self-pay

## 2024-03-17 DIAGNOSIS — R6884 Jaw pain: Secondary | ICD-10-CM | POA: Insufficient documentation

## 2024-03-17 DIAGNOSIS — G8929 Other chronic pain: Secondary | ICD-10-CM | POA: Diagnosis present

## 2024-03-17 DIAGNOSIS — M542 Cervicalgia: Secondary | ICD-10-CM | POA: Diagnosis present

## 2024-03-17 NOTE — Therapy (Signed)
 " OUTPATIENT PHYSICAL THERAPY CERVICAL EVALUATION   Patient Name: Holly Diaz MRN: [id number] DOB:1965-09-08, 58 y.o., female Today's Date: 03/17/2024  END OF SESSION:  PT End of Session - 03/17/24 1403     Visit Number 1    Date for Recertification  06/09/24    PT Start Time 1405    PT Stop Time 1445    PT Time Calculation (min) 40 min    Activity Tolerance Patient tolerated treatment well    Behavior During Therapy WFL for tasks assessed/performed          Past Medical History:  Diagnosis Date   Allergy    Arthritis    knees   Diabetes mellitus without complication (HCC)    Type 2, follows w/ PCP, Dr. Madelin Brought   Fibroids    Hypertension    Follows w/ PCP, Dr. Madelin Brought.   Liver lesion    per 10/25/22 MRI in [facility] : inferior left lobe of liver, almost certainly a benign, slowly filling hemangioma   Wears contact lenses    Wears glasses    Past Surgical History:  Procedure Laterality Date   CESAREAN SECTION     2004 & 2007   COLONOSCOPY  10/12/2021   no polyps   CYSTOSCOPY N/A 01/27/2023   Procedure: CYSTOSCOPY;  Surgeon: Sarrah Browning, MD;  Location: [facility];  Service: Gynecology;  Laterality: N/A;   ENDOMETRIAL ABLATION W/ NOVASURE N/A    around 2018   ROBOTIC ASSISTED LAPAROSCOPIC OVARIAN CYSTECTOMY Left 01/27/2023   Procedure: XI ROBOTIC ASSISTED LAPAROSCOPIC OVARIAN CYSTECTOMY;  Surgeon: Sarrah Browning, MD;  Location: [facility];  Service: Gynecology;  Laterality: Left;   ROBOTIC ASSISTED TOTAL HYSTERECTOMY WITH BILATERAL SALPINGO OOPHERECTOMY N/A 01/27/2023   Procedure: XI ROBOTIC ASSISTED TOTAL HYSTERECTOMY WITH BILATERAL SALPINGECTOMIES;  Surgeon: Sarrah Browning, MD;  Location: [facility];  Service: Gynecology;  Laterality: N/A;   SIGMOIDOSCOPY     normal per pt   Patient Active Problem List   Diagnosis Date Noted   Post-operative state 01/27/2023   Postoperative state  01/27/2023   Myeloproliferative disorder (HCC) 02/16/2019    PCP: Brought Madelin Patch, MD  REFERRING PROVIDER: Avelina Ned, DMD  REFERRING DIAG: TMD   THERAPY DIAG:  Chronic jaw pain - Plan: PT plan of care cert/re-cert  Cervicalgia - Plan: PT plan of care cert/re-cert  Rationale for Evaluation and Treatment: Rehabilitation  ONSET DATE: chronic   SUBJECTIVE:  SUBJECTIVE STATEMENT: Grinding teeth a lot, gone through mouth guards, crown Hand dominance: Right  PERTINENT HISTORY:  Chronic grinding teeth, h.o difficulty opening mouth, locking catching, referred by dentist  PAIN:  Are you having pain? Yes: NPRS scale: 0 to 4 Pain location: B jaws, laterally over masseter Pain description: deep ache Aggravating factors: opening mouth wide for biting food, chewing Relieving factors: had Botox which worked but lasted 5 months  PRECAUTIONS: None  RED FLAGS: None     WEIGHT BEARING RESTRICTIONS: No  FALLS:  Has patient fallen in last 6 months? 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  DIAGNOSTIC FINDINGS:  na  PATIENT SURVEYS:  PSFS: THE PATIENT SPECIFIC FUNCTIONAL SCALE  Place score of 0-10 (0 = unable to perform activity and 10 = able to perform activity at the same level as before injury or problem)  Activity Date: 03/17/24    Open jaw enough to eat a bagel 2    2. Headaches  5    3.talking without fatigue mouth  and jaw 2    4.      Total Score 9/3 : 3      Total Score = Sum of activity scores/number of activities  Minimally Detectable Change: 3 points (for single activity); 2 points (for average score)  [facility] (nd). The Patient Specific Functional Scale . Retrieved from [employer].pt   COGNITION: Overall cognitive status: Within functional limits for tasks assessed  SENSATION: WFL  POSTURE: forward head  PALPATION: Tender b mastoid , B masseter, suboccipitals, temporalis  Jaw opening 35mm, no lateral deviation noted CERVICAL ROM: all wnl Cervical flexion and rotation testing wnl, no restrictions   CERVICAL SPECIAL TESTS:  Cranial cervical flexion test: Negative and Distraction test: Positive    TREATMENT DATE: 03/17/24                                                                                                                              Trigger Point Dry Needling  Initial Treatment: Pt instructed on Dry Needling rational, procedures, and possible side effects.  Patient Verbal Consent Given: Yes Education Handout Provided: Yes Muscles Treated: lat pterygoid, masseter, B 4 pts  Electrical Stimulation Performed: No Treatment Response/Outcome: twitch, improved    Supine for depression of masseter and provided with written directions,  Supine for cervical distraction Massage following TPDN B masseter Inst in isometric cervical flexion and in upper traps stretch, provided with written directions.   PATIENT EDUCATION:  Education details: POC, goals Person educated: Patient Education method: Explanation, Demonstration, Tactile cues, and Verbal cues Education comprehension: verbalized understanding and returned demonstration  HOME  EXERCISE PROGRAM: TBD  ASSESSMENT:  CLINICAL IMPRESSION: Patient is a 58 y.o. female who was evaluated  today by physical therapy evaluation and treatment for TMD B.  She has very restricted jaw opening, thickening noted B masseter musculature, tenderness B over lat jaw musculature.  Cervical spine motion wnl.  Some headaches that are in rams horn pattern perhaps related to upper cervical involvement.  She responded well today to the initial session with TPDN. Also instructed in self stretching and some cervical spine stretching /strengthening.  She is going to evaluate how she responds to the TPDN and stretching and call back for additional appts as needed.   OBJECTIVE IMPAIRMENTS: decreased ROM, hypomobility, increased fascial restrictions, and pain.   ACTIVITY LIMITATIONS: self feeding  PARTICIPATION LIMITATIONS: occupation  PERSONAL FACTORS: Fitness, Past/current experiences, Profession, and Time since onset of injury/illness/exacerbation are also affecting patient's functional outcome.   REHAB POTENTIAL: Good  CLINICAL DECISION MAKING: Evolving/moderate complexity  EVALUATION COMPLEXITY: Moderate   GOALS: Goals reviewed with patient? Yes  SHORT TERM GOALS: Target date: 2 weeks, 03/30/24  I HEP, provided today Baseline:  Goal status: INITIAL   LONG TERM GOALS: Target date: 06/09/24:   Increase mouth opening to greater 50mm Baseline:  Goal status: INITIAL  2.  Reduced incidence of clicking jaw from daily to none Baseline:  Goal status: INITIAL  3.  Improve score on PSFS from 3 to 6 Baseline:  Goal status: INITIAL    PLAN:  PT FREQUENCY: every other week  PT DURATION: 12 weeks  PLANNED INTERVENTIONS: 97110-Therapeutic exercises, 97530- Therapeutic activity, [id number]- Neuromuscular re-education, 97535- Self Care, [id number]- Manual therapy, and Patient/Family education  PLAN FOR NEXT SESSION: reassess mouth movement, palpate, advance therex as needed.   Rashay Barnette L Hjalmar Ballengee, PT, DPT, OCS 03/17/2024, 3:49 PM      "

## 2024-04-07 ENCOUNTER — Ambulatory Visit: Attending: Dentistry

## 2024-04-07 DIAGNOSIS — R6884 Jaw pain: Secondary | ICD-10-CM | POA: Insufficient documentation

## 2024-04-07 DIAGNOSIS — M542 Cervicalgia: Secondary | ICD-10-CM | POA: Diagnosis present

## 2024-04-07 DIAGNOSIS — G8929 Other chronic pain: Secondary | ICD-10-CM | POA: Diagnosis present

## 2024-04-07 NOTE — Therapy (Signed)
 " OUTPATIENT PHYSICAL THERAPY TMJ TREATMENT   Patient Name: Holly Diaz MRN: [id number] DOB:Sep 07, 1965, 59 y.o., female Today's Date: 04/08/2024  END OF SESSION:  PT End of Session - 04/07/24 1606     Visit Number 2    Date for Recertification  06/09/24    PT Start Time 1602    PT Stop Time 1648    PT Time Calculation (min) 46 min    Activity Tolerance Patient tolerated treatment well    Behavior During Therapy WFL for tasks assessed/performed           Past Medical History:  Diagnosis Date   Allergy    Arthritis    knees   Diabetes mellitus without complication (HCC)    Type 2, follows w/ PCP, Dr. Madelin Brought   Fibroids    Hypertension    Follows w/ PCP, Dr. Madelin Brought.   Liver lesion    per 10/25/22 MRI in Care Everywhere : inferior left lobe of liver, almost certainly a benign, slowly filling hemangioma   Wears contact lenses    Wears glasses    Past Surgical History:  Procedure Laterality Date   CESAREAN SECTION     2004 & 2007   COLONOSCOPY  10/12/2021   no polyps   CYSTOSCOPY N/A 01/27/2023   Procedure: CYSTOSCOPY;  Surgeon: Sarrah Browning, MD;  Location: [facility];  Service: Gynecology;  Laterality: N/A;   ENDOMETRIAL ABLATION W/ NOVASURE N/A    around 2018   ROBOTIC ASSISTED LAPAROSCOPIC OVARIAN CYSTECTOMY Left 01/27/2023   Procedure: XI ROBOTIC ASSISTED LAPAROSCOPIC OVARIAN CYSTECTOMY;  Surgeon: Sarrah Browning, MD;  Location: [facility];  Service: Gynecology;  Laterality: Left;   ROBOTIC ASSISTED TOTAL HYSTERECTOMY WITH BILATERAL SALPINGO OOPHERECTOMY N/A 01/27/2023   Procedure: XI ROBOTIC ASSISTED TOTAL HYSTERECTOMY WITH BILATERAL SALPINGECTOMIES;  Surgeon: Sarrah Browning, MD;  Location: [facility];  Service: Gynecology;  Laterality: N/A;   SIGMOIDOSCOPY     normal per pt   Patient Active Problem List   Diagnosis Date Noted   Post-operative state 01/27/2023   Postoperative state  01/27/2023   Myeloproliferative disorder (HCC) 02/16/2019    PCP: Brought Madelin Patch, MD  REFERRING PROVIDER: Avelina Ned, DMD  REFERRING DIAG: TMD   THERAPY DIAG:  Chronic jaw pain  Cervicalgia  Rationale for Evaluation and Treatment: Rehabilitation  ONSET DATE: chronic   SUBJECTIVE:  SUBJECTIVE STATEMENT: better jaw tightness and pain for 3 1/2 weeks after last visit.  Having trouble placing tongue on roof of mouth. Was dizzy after initial visit last time for a couple of weeks  Hand dominance: Right  PERTINENT HISTORY:  Chronic grinding teeth, h.o difficulty opening mouth, locking catching, referred by dentist  PAIN:  Are you having pain? Yes: NPRS scale: 0 to 4 Pain location: B jaws, laterally over masseter Pain description: deep ache Aggravating factors: opening mouth wide for biting food, chewing Relieving factors: had Botox which worked but lasted 5 months  PRECAUTIONS: None  RED FLAGS: None     WEIGHT BEARING RESTRICTIONS: No  FALLS:  Has patient fallen in last 6 months? 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  DIAGNOSTIC FINDINGS:  na  PATIENT SURVEYS:  PSFS: THE PATIENT SPECIFIC FUNCTIONAL SCALE  Place score of 0-10 (0 = unable to perform activity and 10 = able to perform activity at the same level as before injury or problem)  Activity Date: 03/17/24    Open jaw enough to eat a bagel 2    2.  Headaches  5    3.talking without fatigue mouth and jaw 2    4.      Total Score 9/3 : 3      Total Score = Sum of activity scores/number of activities  Minimally Detectable Change: 3 points (for single activity); 2 points (for average score)  [facility] (nd). The Patient Specific Functional Scale . Retrieved from [employer].pt   COGNITION: Overall cognitive status: Within functional limits for tasks assessed  SENSATION: WFL  POSTURE: forward head  PALPATION: Tender b mastoid , B masseter, suboccipitals, temporalis  Jaw opening 35mm, no lateral deviation noted CERVICAL ROM: all wnl Cervical flexion and rotation testing wnl, no restrictions   CERVICAL SPECIAL TESTS:  Cranial cervical flexion test: Negative and Distraction test: Positive    TREATMENT DATE:04/08/24:  Pt education in stretching for mandible depression using thumb and index finger with a pointer motion Also in manually shifting jaw into med and lat excursion, to reduce tension in masseter musculature  Trigger Point Dry Needling  performed by Ozell Mainland PT  Subsequent Treatment: Instructions provided previously at initial dry needling treatment.   Patient Verbal Consent Given: Yes Education Handout Provided: Previously Provided Muscles Treated: Masseter on R 3 pt,s L 2 pts Electrical Stimulation Performed: No Treatment Response/Outcome: twitch, and pt noted immediate relaxation of musculature  Followed with deep massage B over same musculature Also isometric engagement of lateral excursion jaw, 5 sec holds and instructed pt to replicate for reducing tension masseter      03/17/24  Trigger Point Dry Needling  Initial Treatment: Pt instructed on Dry Needling rational, procedures, and possible side  effects.  Patient Verbal Consent Given: Yes Education Handout Provided: Yes Muscles Treated: lat pterygoid, masseter, B 4 pts  Electrical Stimulation Performed: No Treatment Response/Outcome: twitch, improved    Supine for depression of masseter and provided with written directions,  Supine for cervical distraction Massage following TPDN B masseter Inst in isometric cervical flexion and in upper traps stretch, provided with written directions.   PATIENT EDUCATION:  Education details: POC, goals Person educated: Patient Education method: Explanation, Demonstration, Tactile cues, and Verbal cues Education comprehension: verbalized understanding and returned demonstration  HOME EXERCISE PROGRAM: TBD  ASSESSMENT:  CLINICAL IMPRESSION:04/08/24:  Continued with techniques designed to address B masseter tightness and overuse , with therex, mass and TPDN. She had a more pronounced positive response to the TPDN today notable twitch and pt commented on relief .  She should be able to improve with these techniques also with self management as instructed today with the isometrics and the med/lat excursion lower jaw to reset, relax masseter B Patient is a 59 y.o. female who was evaluated  today by physical therapy evaluation and treatment for TMD B. She has very restricted jaw opening, thickening noted B masseter musculature, tenderness B over lat jaw musculature.  Cervical spine motion wnl.  Some headaches that are in rams horn pattern perhaps related to upper cervical involvement.  She responded well today to the initial session with TPDN. Also instructed in self stretching and some cervical spine stretching /strengthening.  She is going to evaluate how she responds to the TPDN and stretching and call back for additional appts as needed.   OBJECTIVE IMPAIRMENTS: decreased ROM, hypomobility, increased fascial restrictions, and pain.   ACTIVITY LIMITATIONS: self feeding  PARTICIPATION LIMITATIONS:  occupation  PERSONAL FACTORS: Fitness, Past/current experiences, Profession, and Time since onset of injury/illness/exacerbation are also affecting patient's functional outcome.   REHAB POTENTIAL: Good  CLINICAL DECISION MAKING: Evolving/moderate complexity  EVALUATION COMPLEXITY: Moderate   GOALS: Goals reviewed with patient? Yes  SHORT TERM GOALS: Target date: 2 weeks, 03/30/24  I HEP, provided today Baseline:  Goal status: INITIAL   LONG TERM GOALS: Target date: 06/09/24:   Increase mouth opening to greater 50mm Baseline:  Goal status: INITIAL  2.  Reduced incidence of clicking jaw from daily to none Baseline:  Goal status: INITIAL  3.  Improve score on PSFS from 3 to 6 Baseline:  Goal status: INITIAL    PLAN:  PT FREQUENCY: every other week  PT DURATION: 12 weeks  PLANNED INTERVENTIONS: 97110-Therapeutic exercises, 97530- Therapeutic activity, [id number]- Neuromuscular re-education, 97535- Self Care, [id number]- Manual therapy, and Patient/Family education  PLAN FOR NEXT SESSION: reassess mouth movement, palpate, advance therex as needed.   Ubah Radke L Thomasena Vandenheuvel, PT, DPT, OCS 04/08/2024, 5:51 PM      "

## 2024-04-08 ENCOUNTER — Other Ambulatory Visit: Payer: Self-pay
